# Patient Record
Sex: Female | Born: 1965
Health system: Southern US, Community
[De-identification: ages and names within clinical notes are randomized; demographics above are authoritative.]

## PROBLEM LIST (undated history)

## (undated) DIAGNOSIS — F32A Depression, unspecified: Secondary | ICD-10-CM

## (undated) DIAGNOSIS — F419 Anxiety disorder, unspecified: Secondary | ICD-10-CM

## (undated) DIAGNOSIS — I10 Essential (primary) hypertension: Secondary | ICD-10-CM

## (undated) DIAGNOSIS — D369 Benign neoplasm, unspecified site: Secondary | ICD-10-CM

## (undated) HISTORY — DX: Essential (primary) hypertension: I10

## (undated) HISTORY — PX: POLYPECTOMY: SHX149

## (undated) HISTORY — DX: Depression, unspecified: F32.A

## (undated) HISTORY — DX: Benign neoplasm, unspecified site: D36.9

## (undated) HISTORY — DX: Anxiety disorder, unspecified: F41.9

## (undated) HISTORY — PX: COLONOSCOPY: SHX174

## (undated) HISTORY — PX: HERNIA REPAIR: SHX51

## (undated) HISTORY — PX: TUBAL LIGATION: SHX77

---

## 2000-06-21 ENCOUNTER — Other Ambulatory Visit: Admission: RE | Admit: 2000-06-21 | Discharge: 2000-06-21 | Payer: Self-pay | Admitting: Obstetrics & Gynecology

## 2000-07-06 ENCOUNTER — Encounter: Admission: RE | Admit: 2000-07-06 | Discharge: 2000-10-04 | Payer: Self-pay | Admitting: Obstetrics & Gynecology

## 2000-12-27 ENCOUNTER — Inpatient Hospital Stay (HOSPITAL_COMMUNITY): Admission: AD | Admit: 2000-12-27 | Discharge: 2000-12-30 | Payer: Self-pay | Admitting: Obstetrics & Gynecology

## 2000-12-27 ENCOUNTER — Encounter (INDEPENDENT_AMBULATORY_CARE_PROVIDER_SITE_OTHER): Payer: Self-pay

## 2000-12-31 ENCOUNTER — Encounter: Admission: RE | Admit: 2000-12-31 | Discharge: 2001-01-30 | Payer: Self-pay | Admitting: Obstetrics & Gynecology

## 2001-01-28 ENCOUNTER — Other Ambulatory Visit: Admission: RE | Admit: 2001-01-28 | Discharge: 2001-01-28 | Payer: Self-pay | Admitting: Obstetrics & Gynecology

## 2002-04-01 ENCOUNTER — Other Ambulatory Visit: Admission: RE | Admit: 2002-04-01 | Discharge: 2002-04-01 | Payer: Self-pay | Admitting: Obstetrics & Gynecology

## 2004-01-06 ENCOUNTER — Other Ambulatory Visit: Admission: RE | Admit: 2004-01-06 | Discharge: 2004-01-06 | Payer: Self-pay | Admitting: Obstetrics & Gynecology

## 2005-06-06 ENCOUNTER — Other Ambulatory Visit: Admission: RE | Admit: 2005-06-06 | Discharge: 2005-06-06 | Payer: Self-pay | Admitting: Obstetrics and Gynecology

## 2006-10-26 ENCOUNTER — Inpatient Hospital Stay (HOSPITAL_COMMUNITY): Admission: RE | Admit: 2006-10-26 | Discharge: 2006-10-29 | Payer: Self-pay | Admitting: Obstetrics & Gynecology

## 2007-05-25 ENCOUNTER — Inpatient Hospital Stay (HOSPITAL_COMMUNITY): Admission: RE | Admit: 2007-05-25 | Discharge: 2007-05-26 | Payer: Self-pay | Admitting: Surgery

## 2007-05-31 ENCOUNTER — Ambulatory Visit (HOSPITAL_COMMUNITY): Admission: RE | Admit: 2007-05-31 | Discharge: 2007-05-31 | Payer: Self-pay | Admitting: Surgery

## 2008-07-10 ENCOUNTER — Ambulatory Visit (HOSPITAL_COMMUNITY): Admission: RE | Admit: 2008-07-10 | Discharge: 2008-07-10 | Payer: Self-pay | Admitting: Surgery

## 2008-07-10 ENCOUNTER — Encounter (INDEPENDENT_AMBULATORY_CARE_PROVIDER_SITE_OTHER): Payer: Self-pay | Admitting: Surgery

## 2011-01-31 NOTE — Op Note (Signed)
NAME:  NAUTIA, LEM NO.:  [id number]   MEDICAL RECORD NO.:  [id number]          PATIENT TYPE:  AMB   LOCATION:  DAY                          FACILITY:  [facility]   PHYSICIAN:  Ardeth Sportsman, MD     DATE OF BIRTH:  06/01/1966   DATE OF PROCEDURE:  05/24/2007  DATE OF DISCHARGE:                               OPERATIVE REPORT   PRIMARY CARE PHYSICIAN:  Gabriel Earing, MD   OB/GYN:  Freddy Finner, MD   SURGEON:  Ardeth Sportsman, MD   ASSISTANT:  Wilmon Arms. Tsuei, MD   PREOPERATIVE DIAGNOSES:  1. Suprapubic ventral wall incisional hernia, status post cesarean      section.  2. Periumbilical ventral wall hernia.   POSTOPERATIVE DIAGNOSES:  1. Suprapubic ventral wall incisional hernia, incarcerated, status      post cesarean section.  2. Periumbilical ventral wall hernia.   PROCEDURES PERFORMED:  1. Laparoscopic lysis of adhesions x30 minutes.  2. Laparoscopic suprapubic ventral wall hernia repair with 20 x 30 cm      mesh, (low-density polypropylene/cellulose anti-coated dual-sided      mesh).  3. Laparoscopic ventral hernia repair with periumbilical ventral wall      hernia with 15 x 20 cm mesh, (low-density polypropylene/cellulose      anti-coated dual-sided mesh).  4. Repair of bladder injury in two-layer closure.  5. Bladder tacking to pubis.   ANESTHESIA:  1. General anesthesia.  2. Local anesthetic in a field block around all port and incisions      sites.   SPECIMENS:  None.   DRAINS:  None.   ESTIMATED BLOOD LOSS:  10 mL.   COMPLICATIONS:  Small bladder injury on the anterior dome of bladder  wall, repaired in two layers.   INDICATIONS:  Ms. Sonntag is a pleasant 45 year old severely obese female  who had to have a C-section for delivery of her child back in May  earlier this year.  She developed a postoperative hematoma and,  unfortunately, developed an incisional hernia.  She was initially sent  to my partner, Dr. Earlene Plater, but she was  sent to me for consideration of  laparoscopic repair.  She also had a periumbilical hernia as well.   The anatomy and physiology of abdominal formation was explained.  Pathophysiology of herniation with its risks of incarceration,  strangulation, debilitating pain and its natural history discussed.  Options were discussed and a recommendation was made for laparoscopic  lysis of adhesions and repair of suprapubic and periumbilical hernias.  Risks such as stroke, heart attack, deep venous thrombosis, pulmonary  embolism and death were discussed.  Risks such as bleeding, need for  transfusion, wound infection, abscess, injury the other organs,  prolonged pain, recurrent hernia, another reoperation, conversion to  open, abd other risks were discussed.  Questions answered and she agreed  to proceed.   OPERATIVE FINDINGS:  1. She had an 8 x 6 cm suprapubic midline ventral wall hernia defect.  2. She had a 4x4 cm periumbilical hernia defect.  3. She had some thick adhesions between her pubis and her bladder  with      a resulting of bladder injury, but it was very small.  It was a      millimeter mucosal injury that was easily closed in two layers.  4. Her hernia was incarcerated with omentum.  5. Her periumbilical ventral hernia was not incarcerated.   DESCRIPTION OF PROCEDURE:  Informed consent was confirmed.  The patient  received IV cefazolin just prior to surgery.  She had sequential  compression devices active during the entire case.  Informed consent was  confirmed.  She underwent general anesthesia without any difficulty.  She was positioned supine with both arms tucked.  Her abdomen and pubis  were clipped, prepped and draped in a sterile fashion.  Entry was gained  in the abdomen with the patient in steep reverse Trendelenburg and left  side up, and placement of a 5 mm port was done using optical entry with  the 5 mm/30-degree scope.  Camera inspection revealed no intra-abdominal   injury.  Under direct visualization the 5-mm ports were placed in the  left flank, left lower quadrant, right upper quadrant and right flank.  A 10-mm port was placed infraumbilically through the periumbilical  hernia.   Diagnostic laparoscopy revealed some dense omental adhesions  incarcerated within her suprapubic hernia.  These were freed off using a  careful harmonic dissection.  The patient was placed in the  Trendelenburg position to allow the abdominal contents to free off of  the pelvis.  A nick was made in the peritoneal covering over the pubic  bone and careful dissection was done to help free the bladder off the  anterior pelvis.  Unfortunately, during dissection a nick was made in  the bladder.  It was about a 1-mm hole.  I closed the stitch  laparoscopically in two layers using 3-0 PDS stitch including a mucosal  stitch to good result.  Further dissection was done to help free the  dome of the bladder off the anterior pelvic wall.  Careful dissection  was done laterally.  The peritoneum was taken down in sort of a TAPP  fashion over the anterior pelvic rim and over laterally in bilateral  lower quadrants.  The round ligaments were identified, skeletonized and  ligated and transected using ultrasonic dissection to allow the pelvic  contents to fall away.   Defect was measured and a 20 x 30-cm mesh was chosen.  It had #1 PDS and  Ethibond alternating stitches x6 done on the superior half of the rim.  The mesh was rolled up, placed in the abdomen and unrolled.  The mesh  was tucked down way down into the anterior wall pelvis and allowed to  lay over the lateral pelvic sidewalls and up over the pelvic brim onto  the anterior abdominal walls bilaterally.  This inferior half of the  mesh had no fascial stitches placed on it yet.  The superior rim was  tacked to the anterior abdominal wall using a laparoscopic suture passer  under direct visualization such that there were six good  transfascial  bites to help tack the mesh to the anterior abdominal wall.  Tisseel was  placed on the rough side of the inferior half of the mesh and the mesh  was pushed up onto the anterior pelvic wall to help encourage the mesh  to adhere to the pelvic rim and the anterior pelvic wall contents.  Under direct visualization #1 Prolene stitches x2 were brought over the  pubic bone, through the  mesh and then grabbed and placed back in for two  further transfascial stitches to help hold the middle part of the mesh  to the anterior abdominal wall at the level of the pubic bone since I  could not go inferior to that.  Tacks were placed circumferentially  around the edges of the anterior abdominal wall.  I did not use any  tacks down over the pelvic brim or down into the pelvis.  The mesh was  seen to lay well with the help of the Tisseel to the intra-abdominal  wall, and there was no exposed mesh.   Inspection was done and the mesh, unfortunately, could not reach up and  cover periumbilical ventral hernia defect either.  That was measured to  the appropriate effect and a 15 x 20-cm dual-sided Proceed mesh was  placed in a horizontal fashion and tacked to the anterior abdominal wall  using six #1 alternating Ethibond and Prolene stitches to the anterior  abdominal wall to good effect.  A tacker was used to help secure the  rims of the mesh to the anterior wall.  There was overlap of both meshes  and the bottom two fascial stitches of the superior mesh went through  the larger mesh inferiorly to help encourage the meshes to adhere  together.  Tacks were used to also help encourage this as well.  The  mesh covered virtually all the anterior abdominal wall well and there  was overlap of several inches circumferentially around both hernia  defects.   Inspection was made of the bladder and again there was good closure.  The bladder was tacked back to the anterior pelvic wall at the level of  the  pubis bone using a 2-0 PDS to help tack the anterior dome of the  bladder up through the mesh and get some periosteum of the pelvic bone  and back out.  It was secured at both corners and with a running stitch  as well in between to good result.  There was no evidence of any uterine  prolapse or other abnormalities.  Camera inspection revealed no intra-  abdominal injury or bleeding.  Mild irrigation was done with clear  return.  The umbilical port, 10-mm port, was already covered by mesh.  The 5-mm were small defects that did not require any more aggressive  closure.  Under direct visualization all but one port was removed with  no evidence of any bleeding on the peritoneum or skin.  Capnoperitoneum  was completely evacuated from the remaining coordinate.  Ports were  removed.  Port site skin was closed using a 4-0 Monocryl stitch.  Sterile dressings were applied.  Small punctate fascial stitch holes  were closed using a Dermabond stitch.  A gentle binder was placed.  The  patient was extubated and sent to the recovery room and under stable  condition.   I explained the operative findings including the bladder injury and  repair to the patient's husband.  I called Dr. Annabell Howells with [facility] and he agreed with the plan to leave the Foley in for 7 days and  do follow-up  cystogram to ensure no evidence of continued bladder injury and remove  the in a week.  Because she has mesh in there, I will place her on oral  ciprofloxacin for 7 days to help decrease risk of bladder infection.  Questions were answered and the husband been expressed understanding and  appreciation for our efforts.  Ardeth Sportsman, MD  Electronically Signed     SCG/MEDQ  D:  05/24/2007  T:  05/24/2007  Job:  [id number]   cc:   Gabriel Earing, M.D.  Fax: [phone]   Freddy Finner, M.D.  Fax: [phone]   Wilmon Arms. Corliss Skains, M.D.  [address] [place] [address] [place] [address]  [place] J. Annabell Howells, M.D.  Fax:  [phone]

## 2011-01-31 NOTE — Op Note (Signed)
NAME:  Tina Scott, GUEST NO.:  [id number]   MEDICAL RECORD NO.:  [id number]          PATIENT TYPE:  AMB   LOCATION:  DAY                          FACILITY:  [facility]   PHYSICIAN:  Ardeth Sportsman, MD     DATE OF BIRTH:  07-24-1966   DATE OF PROCEDURE:  DATE OF DISCHARGE:                               OPERATIVE REPORT   PRIMARY CARE PHYSICIAN:  Gabriel Earing, M.D.   Also followed by Dr. Jennette Kettle of OB/GYN.   SURGEON:  Ardeth Sportsman, M.D.   ASSISTANT:  Leonie Man, M.D.   PREOPERATIVE DIAGNOSIS:  Suprapubic recurrence of ventral wall hernia.   POSTOPERATIVE DIAGNOSIS:  Suprapubic recurrence of ventral wall hernia.   PROCEDURE PERFORMED:  1. Diagnostic laparoscopy.  2. Open ventral hernia repair with underlay mesh.   ANESTHESIA:  1. General.  2. Local and second field block.   SPECIMENS:  Hernia sac.   DRAINS:  None.   ESTIMATED BLOOD LOSS:  Less than 20 ml.   COMPLICATIONS:  None apparent.   INDICATIONS:  [name] is a 45 year old morbidly obese female who had  periumbilical and suprapubic ventral hernias who underwent repairs,  including bladder take-down and repair in September, 2008.  She came  back to me in late August with evidence of recurrence.   The anatomy and physiology of abdominal formation was discussed, the  pathophysiology of herniation with its risks of incarceration,  strangulation, debilitating pain was discussed.  Options were discussed.  After discussing with numerous partners, the recommendation was made for  diagnostic laparoscopy to make sure there was no evidence of any  incarceration or strangulation.  The probability of laparoscopic versus  open repair was discussed.  The risks, benefits and alternatives,  including hernia recurrence was discussed.  Questions were answered.  She agreed to proceed.   OPERATIVE FINDINGS:  She had a hernia sac with some omentum incarcerated  within it.  There was no evidence of any bowel or  any other  abnormalities in the area.  She had a few adhesions around her  periumbilical ventral hernia and suprapubic ventral hernia repair.  The  mesh seemed to have pulled up in that corner, exposing 2x3cm of hernia  defect.  The bladder was more inferior and caudal to the area of  concern.   DESCRIPTION OF PROCEDURE:  Informed consent was confirmed.  The patient  received IV cefazolin prior to surgery.  She had sequential compression  devices active the entire case.  She underwent transfusion without any  difficulty.  She was positioned supine with arms tucked.  Her abdomen  and perineum were clipped, prepped and draped in a sterile fashion.   A 5-0 port was placed using optical entry technique in the left upper  quadrant and the patient in steep Trendelenburg and left-side up after  an orogastric tube had been used to decompress the stomach.  Camera  inspection revealed no internal abdominal injury.  Under direct  visualization, a 5.0 port was placed in the left mid flank.   Camera inspection revealed moderate omental adhesions to the rim of the  periumbilical ventral hernia mesh.  There were less adhesions down in  the pelvis.  I could see some omentum going up into a hernia defect that  I could push back in and confirm.  There is no bowel or any other vicera  in there.  It was hard to tell exactly where the bladder was for  certain.  We did have a three-way balloon, a three-way Foley, placed.  We filled up the three-way Foley with some diluted methylene blue into  the bladder.  We could see that the dome of the bladder was several  inches inferior to the hernia ridge.   At this point, it was decided to convert to an open underlay repair.  An  8-cm incision was made over the prior Pfannenstiel incision and had  gotten through subcutaneous tissues.  We immediately encountered a  moderate-sized hernia sac.  We were able to skeletonize the hernia sac  all the way down to the  stalk.  We were able to gradually free off the  hernia sac circumferentially until we got to a healthy fascial ridge.  The fascial defect was about 2 x 3 cm in size.  Doing that, we did get  into the peritoneal cavity.  The bladder was reinflated, and again, the  bladder dome was about 5 cm inferior towards the ridge of the hernia.  After discussion with Dr. Lurene Shadow, it was decided to do an underlay  repair using a Proceed umbilical hernia patch type of mesh.  I used #1  Novofil and took some good horizontal mattress bites, taking a bite  through the fascia, then in and out of the mesh, then back out the  fascia in the four corners to help tuck the Proceed hernia patch in.  There was about a 4.5 cm diameter patch.  The stitches were tied down.  The tails were trimmed down and tucked back in.  The hernia defect was  primarily closed using #1 PDS figure-of-eight buried stitches to good  approximation.  Hemostasis was good.  The deep subcutaneous tissues were  reapproximated using interrupted figure-of-eight 4-0 Monocryl stitches,  and the skin was closed using a running 4-0 Monocryl stitch through a  large incision.   Diagnostic laparoscopy was reperformed.  The mesh seemed to patch the  area well, and there was no evidence of any involvement in the other  issues.  The bladder was reinflated, and again, it was below where the  mesh was and not injured.  There was no leak of any methylene blue or  any evidence of bladder injury.  Bowel looked otherwise normal.  I  elected to leave the omental adhesions to the mesh alone, since there  was just omentum, and there was no  evidence of any bowel obstruction or other abnormalities.  The patient  was extubated and sent to the recovery room in stable condition.   I am about to explained operative findings to the patient's family.      Ardeth Sportsman, MD  Electronically Signed     SCG/MEDQ  D:  07/10/2008  T:  07/10/2008  Job:  [id number]    cc:   Gabriel Earing, M.D.  Fax: [phone]   Freddy Finner, M.D.  Fax: [phone]   Dr. Azucena Kuba

## 2011-02-03 NOTE — Discharge Summary (Signed)
[facility]  Patient:    Tina Scott, Tina Scott                     MRN: [id number] Adm. Date:  [id number] Disc. Date: [id number] Attending:  Rhina Brackett Dictator:   Danie Chandler, R.N.                           Discharge Summary  ADMITTING DIAGNOSES:          1. A 36 6/7 week intrauterine pregnancy.                               2. Preterm premature rupture of membranes.                               3. Breech presentation.  DISCHARGE DIAGNOSES:          1. A 36 6/7 week intrauterine pregnancy.                               2. Preterm premature rupture of membranes.                               3. Breech presentation.  PROCEDURE:                    On December 27, 2000 primary low transverse cesarean section.  REASON FOR ADMISSION:         The patient is a 45 year old gravida 1, para 0 with an estimated date of delivery of Jan 17, 2001.  The patient has a known breech presentation and was scheduled for cesarean section later in the month.  The patient presented with rupture of membranes, thin meconium.  Ultrasound verified breech presentation.  The plan was to proceed with a primary low transverse cesarean section.  HOSPITAL COURSE:              The patient was taken to the operating room and underwent the above named procedure without complication.  This was productive of a viable female infant with Apgars of 8 at one minute and 9 at five minutes.  Postoperatively on day #1 the patient was without complaint.  Her hemoglobin on this day was 10.3, hematocrit 29.8, and white blood cell count 14.5.  On postoperative day #2 the patient had good control of pain, was ambulating well without difficulty.  She also had a return of bowel function and was tolerating a regular diet.  She was discharged home on postoperative day #3.  CONDITION ON DISCHARGE:       Good.  DIET:                         Regular, as tolerated.  ACTIVITY:                      No heavy lifting, no driving, no vaginal entry.  FOLLOW-UP:                    She is to follow-up in the office in one to two weeks for incision check.  She is to call for temperature greater than  100 degrees, persistent nausea or vomiting, heavy vaginal bleeding, and/or redness or drainage from the incision site.  DISCHARGE MEDICATIONS:        1. Prenatal vitamins one p.o. q.d.                               2. Percocet #40 as directed by M.D.                               3. Motrin #30 as directed by M.D.DD:  01/14/01 TD:  01/14/01 Job: [id number]

## 2011-02-03 NOTE — Discharge Summary (Signed)
NAMEOVAL, MORALEZ NO.:  [id number]   MEDICAL RECORD NO.:  [id number]          PATIENT TYPE:  INP   LOCATION:  9129                          FACILITY:  [facility]   PHYSICIAN:  Juluis Mire, M.D.   DATE OF BIRTH:  Oct 24, 1965   DATE OF ADMISSION:  10/26/2006  DATE OF DISCHARGE:  10/29/2006                               DISCHARGE SUMMARY   ADMITTING DIAGNOSES:  1. Intrauterine pregnancy at term.  2. Previous cesarean section requests repeat cesarean delivery.  3. Multiparity desires permanent sterilization.   DISCHARGE DIAGNOSES:  1. Status post low transverse cesarean section.  2. Viable female infant.  3. Permanent sterilization.   PROCEDURES:  1. Repeat low transverse cesarean section.  2. Bilateral tubal ligation.   HOSPITAL COURSE:  The patient was a 45 year old white married female  gravida 2, para 1 that presented to [facility] for  scheduled cesarean section.  The patient had a previous cesarean section  and desired repeat.  Due to multiparity, the patient also had requested  permanent sterilization.  On the morning of admission, the patient was  taken to the operating room where spinal anesthesia was administered  without difficulty.  Low transverse incision was made with the delivery  of a viable female infant weighing 8 pounds 11 ounces with Apgars of 9 at  one minute and 9 at five minutes.  Arterial cord pH was 7.32.  Bilateral  tubal ligation was performed using Filshie clips.  The patient tolerated  procedure well and was taken to the recovery room in stable condition.  On postoperative day #1, the patient was without complaint; vital signs  were stable; she was afebrile; abdomen soft; fundus firm and nontender;  abdominal dressing is noted to be clean, dry and intact; urine output  was noted to be within normal limits.  On postoperative day #2, the  patient was without complaint; vital signs remained stable; she was  afebrile;  fundus firm and nontender; abdominal dressing had been removed  revealing an incision that was clean, dry and intact.  Laboratory  findings revealed hemoglobin of 9.6.  On postoperative day #3, the  patient complained of some pain in the right hip without paresthesia;  pain was only exhibited with the patient standing; she was not having  any difficulty with ambulation; vital signs were stable; she was  afebrile; abdomen soft with good return of bowel function; fundus was  firm and nontender; incision was noted to have a slight amount of  drainage near the right margin of the incision; staples were left in  place.  Discharge instructions reviewed and the patient was later  discharged home.   CONDITION ON DISCHARGE:  Good.   DIET:  Regular as tolerated.   ACTIVITY:  No heavy lifting, no driving x2 weeks, no vaginal entry.   FOLLOW UP:  Patient to follow up in the office in 2-3 days for staple  removal.  She is to call for temperature greater than 100 degrees,  persistent nausea, vomiting, heavy vaginal bleeding and/or redness or  drainage from the incisional site.   DISCHARGE  MEDICATIONS:  1. Percocet 5/325 #30 one p.o. every 4-6 hours p.r.n.  2. Motrin 600 mg every 6 hours.  3. Prenatal vitamins one p.o. daily.  4. Colace one p.o. daily p.r.n.      Julio Sicks, N.P.      Juluis Mire, M.D.  Electronically Signed    CC/MEDQ  D:  11/18/2006  T:  11/18/2006  Job:  [id number]

## 2011-02-03 NOTE — H&P (Signed)
[facility]  Patient:    Tina Scott, Tina Scott                     MRN: [id number] Adm. Date:  [id number] Attending:  Rhina Brackett                         History and Physical  CHIEF COMPLAINT:              Ruptured membranes at 36 weeks, history of breech presentation.  HISTORY OF PRESENT ILLNESS:   A 45 year old G1 P0, EDD May 2.  This patient is a known breech presentation, was scheduled for cesarean later this month, presents with rupture of membranes, thin meconium.  Ultrasound verified breech presentation.  The plan is to proceed with primary low transverse cesarean section.  PAST MEDICAL HISTORY:  ALLERGIES:                    None.  OPERATIONS:                   Oral surgery at age 16, otherwise unremarkable.  LABORATORY DATA:              Blood type B positive.  Rubella titer is positive.  One-hour GCT was 127.  PHYSICAL EXAMINATION:  VITAL SIGNS:                  Temperature 98.6, blood pressure 133/72.  HEENT:                        Unremarkable.  NECK:                         Supple without masses.  LUNGS:                        Clear.  CARDIOVASCULAR:               Regular rate and rhythm without murmurs, rubs, or gallops noted.  BREASTS:                      Not examined.  ABDOMEN:                      Term fundal height, fetal heart rate 140. Breech by ultrasound.  PELVIC:                       Cervix was 1 cm, long.  Thin meconium fluid was noted.  EXTREMITIES AND NEUROLOGIC:   Unremarkable.  IMPRESSION:                   1. Intrauterine pregnancy at 36 and six-sevenths                                  weeks.                               2. Preterm premature rupture of membranes.                               3. Breech presentation.  PLAN:  Primary cesarean section.  Procedure and risk discussed including risk of bleeding, infection, adjacent organ injury, possible need for a  transfusion. DD:  12/27/00 TD:  12/27/00 Job: [id number]

## 2011-02-03 NOTE — Op Note (Signed)
NAMEMarland Kitchen  Tina Scott, Tina Scott NO.:  [id number]   MEDICAL RECORD NO.:  [id number]          PATIENT TYPE:  INP   LOCATION:  9129                          FACILITY:  [facility]   PHYSICIAN:  Freddy Finner, M.D.   DATE OF BIRTH:  Oct 16, 1965   DATE OF PROCEDURE:  10/26/2006  DATE OF DISCHARGE:  10/29/2006                               OPERATIVE REPORT   OPERATIVE PROCEDURE:  1. Repeat low transverse cervical cesarean section.  2. Bilateral tubal ligation using Filshie clips.   SURGEON:  Freddy Finner, M.D.   ANESTHESIA:  Spinal.   ESTIMATED INTRAOPERATIVE BLOOD LOSS:  Less than or equal to 800 mL.   INTRAOPERATIVE COMPLICATIONS:  None.   INDICATION:  The patient is a 45 year old multipara who is admitted now  for repeat cesarean delivery.  She is admitted at term.   DESCRIPTION OF PROCEDURE:  She was brought to the operating room and  there placed under adequate spinal anesthesia and placed in the dorsal  recumbent position with elevation of the right hip.  Because of massive  obesity, the abdomen was tight and retracted by the ether screen.  The  abdomen was then prepped in the usual fashion.  Foley catheter was  placed using sterile technique.  Sterile drapes were applied.  A  transverse lower abdominal incision was made through an old scar and  carried sharply down to fascia, which was entered sharply and extended  to the extent of the skin incision.  Rectus sheath was developed  superiorly and inferiorly with blunt and sharp dissection.  Rectus  muscles were divided in the midline.  Peritoneum was entered sharply and  extended sharply and bluntly to the extent of the skin incision.  Bladder blade was placed.  A transverse incision was made in the  visceroperitoneum overlying the lower segment and bladder bluntly  distended off the left uterine segment.  A transverse incision was made  in the lower uterine segment.  Amnion was entered; fluid was clear.  The  incision  was extended bluntly in a transverse direction.  A Kiwi was  then used to deliver a viable female infant.  Cord blood was obtained for  arterial gases and for routine venous sampling.  Placenta and other  parts of conception were removed manually and this was confirmed by  manual exploration of the uterus.  Uterus was then delivered onto the  anterior abdominal wall.  Lower uterine incision was then closed in a  double layer; running locking 0 Monocryl was used for the first layer  and an imbricating suture of 0 Monocryl for the second.  Hemostasis was  adequate.  Bladder flap was reapproximated with an interrupted figure-of-  eight of 0 Monocryl.  Uterus, tubes and ovaries were normal.  The  Filshie clip device was then used to apply a clip to the isthmic portion  of each fallopian tube.  The uterus was then delivered back into the  abdominal cavity.  Irrigation was carried out.  Hemostasis was adequate.  All pack, needle and instruments were correct.  Abdominal incision was  closed in  layers; running 0 Monocryl was used to close the peritoneum,  fascia was  closed with running 0 PDS, subcutaneous tissue was approximated with a  running 2-0 plain and skin was closed with wide skin staples.  The  patient tolerated the operative procedure well.  She was taken to  Recovery in good condition.      Freddy Finner, M.D.  Electronically Signed     WRN/MEDQ  D:  11/30/2006  T:  11/30/2006  Job:  [id number]

## 2011-02-03 NOTE — H&P (Signed)
NAME:  Tina Scott, Tina Scott NO.:  [id number]   MEDICAL RECORD NO.:  [id number]          PATIENT TYPE:  INP   LOCATION:  NA                            [facility]ACILITY:  [facility]   PHYSICIAN:  Freddy Finner, M.D.   DATE OF BIRTH:  [birth year]   DATE OF ADMISSION:  10/26/2006  DATE OF DISCHARGE:                              HISTORY & PHYSICAL   ADMISSION DIAGNOSES:  1. Intrauterine pregnancy at term.  2. Surgically scarred uterus by previous cesarean delivery.   HISTORY OF PRESENT ILLNESS:  The patient is a 45 year old white married  female, gravida 2, para 1 with her first infant being delivered by  cesarean for breech.  She has requested surgical delivery for this  pregnancy as well as tubal sterilization.  She is admitted at this time  for that purpose.  Her estimated gestational age will be 38-5/7 on the  day of her admission.  Her prenatal course has been essentially  uncomplicated.   CURRENT REVIEW OF SYSTEMS:  Negative.  No cardiopulmonary, GI, or GU  complaints.   PAST MEDICAL HISTORY/FAMILY HISTORY:  According to the prenatal summary  and will not be repeated.   PHYSICAL EXAMINATION:  HEENT:  Grossly within normal limits.  NECK:  Thyroid gland is not palpably enlarged.  VITAL SIGNS:  Blood pressure in the office 126/80.  CHEST:  Clear to auscultation.  HEART:  Normal sinus rhythm without murmurs, rubs, or gallops.  ABDOMEN:  Gravid.  Estimated fetal size of 8 - 8.5 pounds.  EXTREMITIES:  Without clubbing, cyanosis, or edema.   ASSESSMENT:  1. Intrauterine pregnancy at term.  2. Surgically scarred uterus.  3. Multiparity with this delivery and request of sterilization.   PLAN:  Repeat cesarean delivery and tubal sterilization.      Freddy Finner, M.D.  Electronically Signed     WRN/MEDQ  D:  10/25/2006  T:  10/25/2006  Job:  [id number]

## 2011-02-03 NOTE — Op Note (Signed)
NAMEMarland Kitchen  Tina Scott, Tina Scott NO.:  [id number]   MEDICAL RECORD NO.:  [id number]          PATIENT TYPE:  INP   LOCATION:  9199                          [facility]ACILITY:  [facility]   PHYSICIAN:  Freddy Finner, M.D.   DATE OF BIRTH:  04-04-1966   DATE OF PROCEDURE:  10/26/2006  DATE OF DISCHARGE:                    STAT - MUST CHANGE TO CORRECT WORK TYPE   PREOPERATIVE DIAGNOSES:  1. Intrauterine pregnancy at term.  2. Surgically scarred uterus.  3. Request for permanent sterilization.   POSTOPERATIVE DIAGNOSES:  1. Intrauterine pregnancy at term.  2. Surgically scarred uterus.  3. Request for permanent sterilization.   OPERATION/PROCEDURE:  1. Repeat low transverse cervical cesarean section with delivery of      viable female infant. Apgar's of 9 and 9, arterial cord pH is 7.32,      birth weight 8 pounds 11 ounces.  2. Tubal sterilization using Filshie clips.   SURGEON:  Freddy Finner, M.D.   ANESTHESIA:  Spinal.   ESTIMATED BLOOD LOSS:  Less than or equal to 800 mL.   COMPLICATIONS:  None.   The patient was admitted on the morning of surgery. She was given an IV  bolus of Ancef with clamping of the cord. She was brought to the  operating room, she was placed under adequate spinal anesthesia, placed  in the dorsal recumbent position with elevation of the right hip. The  abdomen was prepped and draped in the usual fashion and Foley catheter  placed using sterile technique. A lower abdominal transverse incision  was made through an old scar and carried sharply down to fascia. The  fascia was entered sharply and extended to the extent of the skin  incision. Subcutaneous bleeding vessels were controlled with Bovie. A  rectus sheath was developed superiorly and inferiorly with blunt and  sharp dissection. The rectus muscles were divided in the midline. The  peritoneum was entered sharply and extended bluntly and sharply to the  extent of the skin incision. A bladder blade  was placed. A moist pack  was used to pack the intestinal contents out of the pelvis. A transverse  incision was made in the vesicoperitoneum overlying the lower uterine  segment. Bladder dissected off of the lower segment. A transverse  incision was made in the lower segment which was very thin. Amniotic  fluid was clear. The incision was extended bluntly in a transverse  direction. The kiwi was applied, easy delivery of a viable female infant  with statistics as noted above was then accomplished. The placenta and  other parts of conception were removed from the uterus. The uterus was  delivered onto the anterior abdominal wall. The uterus itself as well as  the fallopian tubes and ovaries were all normal. The uterine incision  was then closed in a double layer with running locking #0 Monocryl for  the first layer and an imbricating suture of #0 Monocryl for the second  layer. A figure-of-eight was required to the right of midline for  complete hemostasis. Small bleeding sources along the bladder flap were  controlled with the Bovie. A Filshie clip was applied to  the isthmic  portion of each fallopian tube. The uterus was then delivered back into  the abdominal cavity. Irrigation was carried out, hemostasis was  complete. Pack, needle and instrument counts were correct. The abdominal  incision was closed in layers. Running #0 Monocryl was used to close the  peritoneum and reapproximate the rectus muscles. The fascia was closed  with double loop #0 PDS running from angle to angle on either side. The  subcu was approximated with a running 2-0 plain. The skin was closed  with wide skin staples. The patient was taken to the recovery room in  good condition.      Freddy Finner, M.D.  Electronically Signed     WRN/MEDQ  D:  10/26/2006  T:  10/26/2006  Job:  [id number]

## 2011-02-03 NOTE — Op Note (Signed)
[facility]  Patient:    Tina Scott, Tina Scott                     MRN: [id number] Proc. Date: 12/27/00 Adm. Date:  [id number] Attending:  Rhina Brackett                           Operative Report  PREOPERATIVE DIAGNOSES:       1. A 36-6/7 week intrauterine pregnancy.                               2. Preterm premature rupture of membranes.                               3. Breech presentation.  POSTOPERATIVE DIAGNOSES:      1. A 36-6/7 week intrauterine pregnancy.                               2. Preterm premature rupture of membranes.                               3. Breech presentation.  PROCEDURE:                    Primary low transverse cesarean section.  SURGEON:                      Duke Salvia. Marcelle Overlie, M.D.  ANESTHESIA:                   Spinal.  COMPLICATIONS:                None.  DRAINS:                       Foley catheter.  ESTIMATED BLOOD LOSS:         800 cc.  PROCEDURE AND FINDINGS:       The patient was taken to the operating room. After an adequate level of spinal anesthetic was obtained with the patient in the left tilt position, the abdomen was prepped and draped in the usual manner for a sterile abdominal procedure.  A Foley catheter was positioned draining clear urine.  A Pfannenstiel incision was made two fingerbreadths above the symphysis and carried down to the fascia, which was incised and extended transversely.  The rectus muscle was divided in the midline.  The peritoneum was entered superiorly without incident and extended in a vertical manner. The vesicouterine cervix was then incised and the bladder was bluntly and sharply dissected off of the lower uterine segment.  A bladder blade was positioned.  A transverse incision was made in the lower segment and extended with bandage scissors.  The patient was delivered of a female, 6 lb 14 oz, with Apgars of 8 and 9 from the frank breech presentation.  Cord pH was  sent. The infant was suctioned.  The cord was clamped and the infant was passed to the pediatric team for further care.  Very minimal to absent meconium was noted by my exam.  After the placenta was removed, the uterus was exteriorized.  The cavity was wiped clean with a laparotomy pack.  Closure was obtained in the first layer with 0 chromic in a lock fashion, followed by an imbricating layer of 0 chromic.  This was hemostatic.  The tubes and ovaries were inspected and noted to be normal.  Prior to closures, sponge, needle and instrument counts were reported as correct x 2.  The rectus muscles were reapproximated in the midline with 2-0 Dexon interrupted sutures.  The fascia was closed from lateral to midline on either side with a 0 Dexon running suture.  The subcutaneous fat was hemostatic.  Clips and Steri-Strips were used on the skin.  She tolerated this well and went to the recovery room in good condition.  She did receive ampicillin IV preoperatively and Pitocin after the cord was clamped. DD:  12/27/00 TD:  12/27/00 Job: [id number]

## 2011-06-19 LAB — HEMOGLOBIN AND HEMATOCRIT, BLOOD
HCT: 36.6
Hemoglobin: 12.4

## 2011-06-30 LAB — HEMOGLOBIN AND HEMATOCRIT, BLOOD
HCT: 37.4
Hemoglobin: 12.9

## 2011-06-30 LAB — PREGNANCY, URINE: Preg Test, Ur: NEGATIVE

## 2014-09-07 ENCOUNTER — Other Ambulatory Visit: Payer: Self-pay | Admitting: Obstetrics and Gynecology

## 2014-09-08 LAB — CYTOLOGY - PAP

## 2016-08-18 DIAGNOSIS — I10 Essential (primary) hypertension: Secondary | ICD-10-CM | POA: Insufficient documentation

## 2017-01-02 DIAGNOSIS — G5601 Carpal tunnel syndrome, right upper limb: Secondary | ICD-10-CM | POA: Insufficient documentation

## 2020-05-10 ENCOUNTER — Telehealth: Payer: Self-pay | Admitting: General Practice

## 2020-05-10 NOTE — Telephone Encounter (Signed)
Patient sent e-mail requesting to establish care at our office. Left voicemail to give the office a call back to schedule.

## 2020-05-25 ENCOUNTER — Other Ambulatory Visit: Payer: Self-pay | Admitting: Nurse Practitioner

## 2020-05-25 ENCOUNTER — Ambulatory Visit: Payer: 59 | Admitting: Nurse Practitioner

## 2020-05-25 ENCOUNTER — Other Ambulatory Visit: Payer: Self-pay

## 2020-05-25 ENCOUNTER — Encounter: Payer: Self-pay | Admitting: Nurse Practitioner

## 2020-05-25 VITALS — BP 126/88 | HR 62 | Temp 97.1°F | Ht 64.0 in | Wt 220.8 lb

## 2020-05-25 DIAGNOSIS — Z860100 Personal history of colon polyps, unspecified: Secondary | ICD-10-CM | POA: Insufficient documentation

## 2020-05-25 DIAGNOSIS — I10 Essential (primary) hypertension: Secondary | ICD-10-CM | POA: Diagnosis not present

## 2020-05-25 DIAGNOSIS — E7800 Pure hypercholesterolemia, unspecified: Secondary | ICD-10-CM | POA: Diagnosis not present

## 2020-05-25 LAB — COMPREHENSIVE METABOLIC PANEL
ALT: 10 U/L (ref 0–35)
AST: 10 U/L (ref 0–37)
Albumin: 4.4 g/dL (ref 3.5–5.2)
Alkaline Phosphatase: 61 U/L (ref 39–117)
BUN: 10 mg/dL (ref 6–23)
CO2: 29 mEq/L (ref 19–32)
Calcium: 10.6 mg/dL — ABNORMAL HIGH (ref 8.4–10.5)
Chloride: 103 mEq/L (ref 96–112)
Creatinine, Ser: 0.65 mg/dL (ref 0.40–1.20)
GFR: 95.33 mL/min (ref >60.00)
Glucose, Bld: 76 mg/dL (ref 70–99)
Potassium: 4.1 mEq/L (ref 3.5–5.1)
Sodium: 139 mEq/L (ref 135–145)
Total Bilirubin: 0.6 mg/dL (ref 0.2–1.2)
Total Protein: 7.1 g/dL (ref 6.0–8.3)

## 2020-05-25 LAB — LIPID PANEL
Cholesterol: 197 mg/dL (ref 0–200)
HDL: 46.6 mg/dL (ref >39.00)
LDL Cholesterol: 128 mg/dL — ABNORMAL HIGH (ref 0–99)
NonHDL: 150.46
Total CHOL/HDL Ratio: 4
Triglycerides: 113 mg/dL (ref 0.0–149.0)
VLDL: 22.6 mg/dL (ref 0.0–40.0)

## 2020-05-25 MED ORDER — LISINOPRIL 20 MG PO TABS: 20.0000 mg | ORAL_TABLET | Freq: Every day | ORAL | 3 refills | Status: DC

## 2020-05-25 NOTE — Assessment & Plan Note (Signed)
>>  ASSESSMENT AND PLAN FOR HX OF COLONIC POLYP WRITTEN ON 05/25/2020 12:09 PM BY ,  LUM, NP  Last colonoscopy 01/2018: benign polyps, recommended repeat in 65yrs

## 2020-05-25 NOTE — Patient Instructions (Signed)
Go to lab for blood draw  Thank you for choosing [facility] for your healthy needs

## 2020-05-25 NOTE — Progress Notes (Signed)
Subjective:  Patient ID: Tina Scott, female    DOB: 23-Apr-1966  Age: 54 y.o. MRN: [id number]  CC: Establish Care (New patient/requesting labs since she takes lisinopril/ last labs completed in Nov. 2020)  HPI  Essential hypertension BP at goal with lisinopril No hx of CKD or hypertensive heart disease or OSA Exercise regimen: walking Diet: low sodium BP Readings from Last 3 Encounters:  05/25/20 126/88   Continue current medication Repeat CMP  Hx of colonic polyps Last colonoscopy 01/2018: benign polyps, recommended repeat in 9yrs  Reviewed past Medical, Social and Family history today.  Outpatient Medications Prior to Visit  Medication Sig Dispense Refill  . Ascorbic Acid (VITAMIN C PO) Take by mouth.    Marland Kitchen b complex vitamins tablet Take 1 tablet by mouth daily.    . Multiple Vitamin (MULTIVITAMIN ADULT PO) Take by mouth.    . Zinc Acetate, Oral, (ZINC ACETATE PO) Take by mouth.    Marland Kitchen lisinopril (ZESTRIL) 20 MG tablet Take 20 mg by mouth daily.     No facility-administered medications prior to visit.    ROS See HPI  Objective:  BP 126/88 (BP Location: Left Arm, Patient Position: Sitting, Cuff Size: Normal)   Pulse 62   Temp (!) 97.1 F (36.2 C) (Temporal)   Ht 5\' 4"  (1.626 m)   Wt 220 lb 12.8 oz (100.2 kg)   SpO2 98%   BMI 37.90 kg/m   Physical Exam Vitals reviewed.  Constitutional:      Appearance: She is obese.  Cardiovascular:     Rate and Rhythm: Normal rate and regular rhythm.     Pulses: Normal pulses.     Heart sounds: Normal heart sounds.  Pulmonary:     Effort: Pulmonary effort is normal.     Breath sounds: Normal breath sounds.  Musculoskeletal:     Right lower leg: No edema.     Left lower leg: No edema.  Neurological:     Mental Status: She is alert and oriented to person, place, and time.  Psychiatric:        Mood and Affect: Mood normal.        Behavior: Behavior normal.     Assessment & Plan:  This visit occurred during the  SARS-CoV-2 public health emergency.  Safety protocols were in place, including screening questions prior to the visit, additional usage of staff PPE, and extensive cleaning of exam room while observing appropriate contact time as indicated for disinfecting solutions.   Tina Scott was seen today for establish care.  Diagnoses and all orders for this visit:  Essential hypertension -     Comprehensive metabolic panel -     lisinopril (ZESTRIL) 20 MG tablet; Take 1 tablet (20 mg total) by mouth daily.  Pure hypercholesterolemia -     Lipid panel  Hx of colonic polyps    Problem List Items Addressed This Visit      Cardiovascular and Mediastinum   Essential hypertension - Primary    BP at goal with lisinopril No hx of CKD or hypertensive heart disease or OSA Exercise regimen: walking Diet: low sodium BP Readings from Last 3 Encounters:  05/25/20 126/88   Continue current medication Repeat CMP      Relevant Medications   lisinopril (ZESTRIL) 20 MG tablet   Other Relevant Orders   Comprehensive metabolic panel     Other   Hx of colonic polyps    Last colonoscopy 01/2018: benign polyps, recommended repeat in 66yrs  Pure hypercholesterolemia   Relevant Medications   lisinopril (ZESTRIL) 20 MG tablet   Other Relevant Orders   Lipid panel      Follow-up: Return in about 6 months (around 11/22/2020) for CPE (breast and pelvic exam, fasting).  Wilfred Lacy, NP

## 2020-05-25 NOTE — Assessment & Plan Note (Signed)
Last colonoscopy 01/2018: benign polyps, recommended repeat in 56yrs

## 2020-05-25 NOTE — Assessment & Plan Note (Signed)
BP at goal with lisinopril No hx of CKD or hypertensive heart disease or OSA Exercise regimen: walking Diet: low sodium BP Readings from Last 3 Encounters:  05/25/20 126/88   Continue current medication Repeat CMP

## 2020-07-05 MED FILL — LISINOPRIL 20 MG TABLET: 20 | 90 days supply | Qty: 90 | Fill #0

## 2020-09-16 MED FILL — LISINOPRIL 20 MG TABLET: 20 | 90 days supply | Qty: 90 | Fill #1

## 2020-11-24 ENCOUNTER — Encounter: Payer: Self-pay | Admitting: Nurse Practitioner

## 2020-11-24 ENCOUNTER — Other Ambulatory Visit: Payer: Self-pay

## 2020-11-24 ENCOUNTER — Other Ambulatory Visit (HOSPITAL_COMMUNITY)
Admission: RE | Admit: 2020-11-24 | Discharge: 2020-11-24 | Disposition: A | Discharge status: Home / Self Care | Payer: 59 | Source: Ambulatory Visit | Attending: Nurse Practitioner | Admitting: Nurse Practitioner

## 2020-11-24 ENCOUNTER — Ambulatory Visit (INDEPENDENT_AMBULATORY_CARE_PROVIDER_SITE_OTHER): Payer: 59 | Admitting: Nurse Practitioner

## 2020-11-24 ENCOUNTER — Other Ambulatory Visit: Payer: Self-pay | Admitting: Nurse Practitioner

## 2020-11-24 VITALS — BP 138/88 | HR 60 | Temp 96.2°F | Ht 64.25 in | Wt 220.6 lb

## 2020-11-24 DIAGNOSIS — Z124 Encounter for screening for malignant neoplasm of cervix: Secondary | ICD-10-CM | POA: Diagnosis not present

## 2020-11-24 DIAGNOSIS — Z85828 Personal history of other malignant neoplasm of skin: Secondary | ICD-10-CM | POA: Diagnosis not present

## 2020-11-24 DIAGNOSIS — Z1231 Encounter for screening mammogram for malignant neoplasm of breast: Secondary | ICD-10-CM | POA: Diagnosis not present

## 2020-11-24 DIAGNOSIS — Z0001 Encounter for general adult medical examination with abnormal findings: Secondary | ICD-10-CM | POA: Diagnosis not present

## 2020-11-24 DIAGNOSIS — Z9889 Other specified postprocedural states: Secondary | ICD-10-CM | POA: Diagnosis not present

## 2020-11-24 DIAGNOSIS — I10 Essential (primary) hypertension: Secondary | ICD-10-CM | POA: Diagnosis not present

## 2020-11-24 DIAGNOSIS — Z86010 Personal history of colonic polyps: Secondary | ICD-10-CM | POA: Diagnosis not present

## 2020-11-24 DIAGNOSIS — E7800 Pure hypercholesterolemia, unspecified: Secondary | ICD-10-CM

## 2020-11-24 LAB — CBC WITH DIFFERENTIAL/PLATELET
Basophils Absolute: 0.1 10*3/uL (ref 0.0–0.1)
Basophils Relative: 1.1 % (ref 0.0–3.0)
Eosinophils Absolute: 0.2 10*3/uL (ref 0.0–0.7)
Eosinophils Relative: 3.1 % (ref 0.0–5.0)
HCT: 41.3 % (ref 36.0–46.0)
Hemoglobin: 13.7 g/dL (ref 12.0–15.0)
Lymphocytes Relative: 31.4 % (ref 12.0–46.0)
Lymphs Abs: 2 10*3/uL (ref 0.7–4.0)
MCHC: 33.2 g/dL (ref 30.0–36.0)
MCV: 85.7 fl (ref 78.0–100.0)
Monocytes Absolute: 0.5 10*3/uL (ref 0.1–1.0)
Monocytes Relative: 7.1 % (ref 3.0–12.0)
Neutro Abs: 3.7 10*3/uL (ref 1.4–7.7)
Neutrophils Relative %: 57.3 % (ref 43.0–77.0)
Platelets: 321 10*3/uL (ref 150.0–400.0)
RBC: 4.82 Mil/uL (ref 3.87–5.11)
RDW: 13.8 % (ref 11.5–15.5)
WBC: 6.5 10*3/uL (ref 4.0–10.5)

## 2020-11-24 LAB — COMPREHENSIVE METABOLIC PANEL
ALT: 13 U/L (ref 0–35)
AST: 9 U/L (ref 0–37)
Albumin: 4.5 g/dL (ref 3.5–5.2)
Alkaline Phosphatase: 78 U/L (ref 39–117)
BUN: 15 mg/dL (ref 6–23)
CO2: 30 mEq/L (ref 19–32)
Calcium: 11.1 mg/dL — ABNORMAL HIGH (ref 8.4–10.5)
Chloride: 103 mEq/L (ref 96–112)
Creatinine, Ser: 0.74 mg/dL (ref 0.40–1.20)
GFR: 92.28 mL/min (ref >60.00)
Glucose, Bld: 86 mg/dL (ref 70–99)
Potassium: 4 mEq/L (ref 3.5–5.1)
Sodium: 138 mEq/L (ref 135–145)
Total Bilirubin: 0.8 mg/dL (ref 0.2–1.2)
Total Protein: 7.5 g/dL (ref 6.0–8.3)

## 2020-11-24 LAB — LIPID PANEL
Cholesterol: 223 mg/dL — ABNORMAL HIGH (ref 0–200)
HDL: 50.3 mg/dL (ref >39.00)
LDL Cholesterol: 156 mg/dL — ABNORMAL HIGH (ref 0–99)
NonHDL: 172.21
Total CHOL/HDL Ratio: 4
Triglycerides: 81 mg/dL (ref 0.0–149.0)
VLDL: 16.2 mg/dL (ref 0.0–40.0)

## 2020-11-24 LAB — TSH: TSH: 2.1 u[IU]/mL (ref 0.35–4.50)

## 2020-11-24 MED ORDER — LISINOPRIL 20 MG PO TABS: 20.0000 mg | ORAL_TABLET | Freq: Every day | ORAL | 3 refills | Status: DC

## 2020-11-24 NOTE — Patient Instructions (Signed)
Go to lab for blood draw.  You will be contacted to schedule appt with GI, dermatology and breast center.  Maintain DASH diet and regular exercise (3-4x/week, at least 51mns of cardio exercise).  Some OTC supplements that can be used to help with hot flashes: passion flower 4029mBID or Magnesium glycinate 35047maily or Primrose oil.  Preventive Care 40-74 54ars Old, Female Preventive care refers to lifestyle choices and visits with your health care provider that can promote health and wellness. This includes:  A yearly physical exam. This is also called an annual wellness visit.  Regular dental and eye exams.  Immunizations.  Screening for certain conditions.  Healthy lifestyle choices, such as: ? Eating a healthy diet. ? Getting regular exercise. ? Not using drugs or products that contain nicotine and tobacco. ? Limiting alcohol use. What can I expect for my preventive care visit? Physical exam Your health care provider will check your:  Height and weight. These may be used to calculate your BMI (body mass index). BMI is a measurement that tells if you are at a healthy weight.  Heart rate and blood pressure.  Body temperature.  Skin for abnormal spots. Counseling Your health care provider may ask you questions about your:  Past medical problems.  Family's medical history.  Alcohol, tobacco, and drug use.  Emotional well-being.  Home life and relationship well-being.  Sexual activity.  Diet, exercise, and sleep habits.  Work and work envStatisticianAccess to firearms.  Method of birth control.  Menstrual cycle.  Pregnancy history. What immunizations do I need? Vaccines are usually given at various ages, according to a schedule. Your health care provider will recommend vaccines for you based on your age, medical history, and lifestyle or other factors, such as travel or where you work.   What tests do I need? Blood tests  Lipid and cholesterol  levels. These may be checked every 5 years, or more often if you are over 50 54ars old.  Hepatitis C test.  Hepatitis B test. Screening  Lung cancer screening. You may have this screening every year starting at age 14 54 you have a 30-pack-year history of smoking and currently smoke or have quit within the past 15 years.  Colorectal cancer screening. ? All adults should have this screening starting at age 54 54d continuing until age 54.17 Your health care provider may recommend screening at age 52 59 you are at increased risk. ? You will have tests every 1-10 years, depending on your results and the type of screening test.  Diabetes screening. ? This is done by checking your blood sugar (glucose) after you have not eaten for a while (fasting). ? You may have this done every 1-3 years.  Mammogram. ? This may be done every 1-2 years. ? Talk with your health care provider about when you should start having regular mammograms. This may depend on whether you have a family history of breast cancer.  BRCA-related cancer screening. This may be done if you have a family history of breast, ovarian, tubal, or peritoneal cancers.  Pelvic exam and Pap test. ? This may be done every 3 years starting at age 54.21 Starting at age 54,54his may be done every 5 years if you have a Pap test in combination with an HPV test. ? This may be done every 3 years starting at age 54.21 Starting at age 54,54his may be done every 5 years if you have a Pap test in combination with an HPV test. Other tests  STD (sexually transmitted disease) testing, if you are at risk.  Bone density scan. This is done to screen for osteoporosis. You may have this scan  if you are at high risk for osteoporosis. Talk with your health care provider about your test results, treatment options, and if necessary, the need for more tests. Follow these instructions at home: Eating and drinking  Eat a diet that includes fresh fruits and vegetables, whole grains, lean protein, and low-fat dairy products.  Take vitamin and mineral supplements as recommended by your health care provider.  Do not  drink alcohol if: ? Your health care provider tells you not to drink. ? You are pregnant, may be pregnant, or are planning to become pregnant.  If you drink alcohol: ? Limit how much you have to 0-1 drink a day. ? Be aware of how much alcohol is in your drink. In the [place] one drink equals one 12 oz bottle of beer (355 mL), one 5 oz glass of wine (148 mL), or one 1 oz glass of hard liquor (44 mL).   Lifestyle  Take daily care of your teeth and gums. Brush your teeth every morning and night with fluoride toothpaste. Floss one time each day.  Stay active. Exercise for at least 30 minutes 5 or more days each week.  Do not use any products that contain nicotine or tobacco, such as cigarettes, e-cigarettes, and chewing tobacco. If you need help quitting, ask your health care provider.  Do not use drugs.  If you are sexually active, practice safe sex. Use a condom or other form of protection to prevent STIs (sexually transmitted infections).  If you do not wish to become pregnant, use a form of birth control. If you plan to become pregnant, see your health care provider for a prepregnancy visit.  If told by your health care provider, take low-dose aspirin daily starting at age 49.  Find healthy ways to cope with stress, such as: ? Meditation, yoga, or listening to music. ? Journaling. ? Talking to a trusted person. ? Spending time with friends and family. Safety  Always wear your seat belt while driving or riding in a vehicle.  Do not drive: ? If you have been drinking alcohol. Do not ride with someone who has been drinking. ? When you are tired or distracted. ? While texting.  Wear a helmet and other protective equipment during sports activities.  If you have firearms in your house, make sure you follow all gun safety procedures. What's next?  Visit your health care provider once a year for an annual wellness visit.  Ask your health care provider how often you should have your  eyes and teeth checked.  Stay up to date on all vaccines. This information is not intended to replace advice given to you by your health care provider. Make sure you discuss any questions you have with your health care provider. Document Revised: 06/08/2020 Document Reviewed: 05/16/2018 Elsevier Patient Education  2021 [employer].

## 2020-11-24 NOTE — Assessment & Plan Note (Signed)
Left shoulder, onset 75yrs ago, has become ulcerated and scaly Previous skin biopsy completed by [place] [facility]: positive basal cell carcinoma Referral to dermatology entered.

## 2020-11-24 NOTE — Assessment & Plan Note (Signed)
Repeat lipid panel Advised about need for regular exercise and DASH diet

## 2020-11-24 NOTE — Assessment & Plan Note (Signed)
>>  ASSESSMENT AND PLAN FOR HX OF COLONIC POLYP WRITTEN ON 11/24/2020  9:21 AM BY ,  LUM, NP  Referred to GI for repeat colonoscopy

## 2020-11-24 NOTE — Assessment & Plan Note (Signed)
Referred to GI for repeat colonoscopy

## 2020-11-24 NOTE — Progress Notes (Signed)
Subjective:    Patient ID: Tina Scott, female    DOB: 1966-04-23, 55 y.o.   MRN: [id number]  Patient presents today for CPE and eval of chronic conditions  HPI Essential hypertension BP at goal with lisinopril BP Readings from Last 3 Encounters:  11/24/20 138/88  05/25/20 126/88   Maintain current medication  Pure hypercholesterolemia Repeat lipid panel Advised about need for regular exercise and DASH diet  Hx of basal cell carcinoma excision Left shoulder, onset 25yrs ago, has become ulcerated and scaly Previous skin biopsy completed by [place] [facility]: positive basal cell carcinoma Referral to dermatology entered.  Hx of colonic polyp Referred to GI for repeat colonoscopy  Sexual History (orientation,birth control, marital status, STD):s/p tubal ligation, needs repeat PAP and mammogram, denies need for STD screen  Depression/Suicide: Depression screen [facility] 2/9 11/24/2020  Decreased Interest 0  Down, Depressed, Hopeless 1  PHQ - 2 Score 1  Altered sleeping 3  Tired, decreased energy 1  Change in appetite 0  Feeling bad or failure about yourself  1  Trouble concentrating 0  Moving slowly or fidgety/restless 0  Suicidal thoughts 0  PHQ-9 Score 6  Difficult doing work/chores Somewhat difficult   Vision:up to date  Dental:up to date  Immunizations: (TDAP, Hep C screen, Pneumovax, Influenza, zoster)  Health Maintenance  Topic Date Due  . Colon Cancer Screening  Never done  . Mammogram  Never done  . Pap Smear  09/07/2017  .  Hepatitis C: One time screening is recommended by Center for Disease Control  (CDC) for  adults born from [birth year] through 1964.   11/24/2021*  . HIV Screening  11/24/2021*  . Tetanus Vaccine  12/27/2026  . Flu Shot  Completed  . COVID-19 Vaccine  Completed  . HPV Vaccine  Aged Out  *Topic was postponed. The date shown is not the original due date.   Diet:regular Weight:  Wt Readings from Last 3 Encounters:  11/24/20 220 lb 9.6 oz  (100.1 kg)  05/25/20 220 lb 12.8 oz (100.2 kg)    Fall Risk: No flowsheet data found.  Medications and allergies reviewed with patient and updated if appropriate.  Patient Active Problem List   Diagnosis Date Noted  . Hx of basal cell carcinoma excision 11/24/2020  . Hx of colonic polyp 05/25/2020  . Pure hypercholesterolemia 05/25/2020  . Carpal tunnel syndrome on right 01/02/2017  . Essential hypertension 08/18/2016    Current Outpatient Medications on File Prior to Visit  Medication Sig Dispense Refill  . Ascorbic Acid (VITAMIN C PO) Take by mouth.    Marland Kitchen b complex vitamins tablet Take 1 tablet by mouth daily.    . Multiple Vitamin (MULTIVITAMIN ADULT PO) Take by mouth.    . Zinc Acetate, Oral, (ZINC ACETATE PO) Take by mouth.     No current facility-administered medications on file prior to visit.    Past Medical History:  Diagnosis Date  . Hypertension     Past Surgical History:  Procedure Laterality Date  . CESAREAN SECTION     x2  . HERNIA REPAIR     Hernia Revision also.   . TUBAL LIGATION      Social History   Socioeconomic History  . Marital status: Married    Spouse name: Not on file  . Number of children: 2  . Years of education: Not on file  . Highest education level: Not on file  Occupational History  . Occupation: registered Optician, dispensing: [employer]  Tobacco Use  . Smoking status: Former Research scientist (life sciences)  . Smokeless tobacco: Never Used  Vaping Use  . Vaping Use: Never used  Substance and Sexual Activity  . Alcohol use: Yes    Comment: occasionally  . Drug use: Never  . Sexual activity: Yes    Birth control/protection: Surgical  Other Topics Concern  . Not on file  Social History Narrative  . Not on file   Social Determinants of Health   Financial Resource Strain: Not on file  Food Insecurity: Not on file  Transportation Needs: Not on file  Physical Activity: Not on file  Stress: Not on file  Social Connections: Not on file     Family History  Problem Relation Age of Onset  . Hypertension Mother   . Hyperlipidemia Mother   . Osteoporosis Mother   . Cancer Maternal Grandmother 62       colon cancer       Review of Systems  Constitutional: Negative for fever, malaise/fatigue and weight loss.  HENT: Negative for congestion and sore throat.   Eyes:       Negative for visual changes  Respiratory: Negative for cough and shortness of breath.   Cardiovascular: Negative for chest pain, palpitations and leg swelling.  Gastrointestinal: Negative for blood in stool, constipation, diarrhea and heartburn.  Genitourinary: Negative for dysuria, frequency and urgency.  Musculoskeletal: Negative for falls, joint pain and myalgias.  Skin: Positive for rash.  Neurological: Negative for dizziness, sensory change and headaches.  Endo/Heme/Allergies: Does not bruise/bleed easily.  Psychiatric/Behavioral: Negative for depression, substance abuse and suicidal ideas. The patient has insomnia. The patient is not nervous/anxious.    Objective:   Vitals:   11/24/20 0808 11/24/20 0830  BP: 116/70 138/88  Pulse: 60   Temp: (!) 96.2 F (35.7 C)   SpO2: 98%    Body mass index is 37.57 kg/m.  Physical Examination:  Physical Exam Vitals reviewed. Exam conducted with a chaperone present.  Constitutional:      General: She is not in acute distress.    Appearance: She is well-developed. She is obese.  HENT:     Right Ear: Tympanic membrane, ear canal and external ear normal.     Left Ear: Tympanic membrane, ear canal and external ear normal.     Mouth/Throat:     Mouth: Oropharynx is clear and moist.  Eyes:     Extraocular Movements: Extraocular movements intact and EOM normal.     Conjunctiva/sclera: Conjunctivae normal.  Cardiovascular:     Rate and Rhythm: Normal rate and regular rhythm.     Pulses: Normal pulses.     Heart sounds: Normal heart sounds.  Pulmonary:     Effort: Pulmonary effort is normal. No  respiratory distress.     Breath sounds: Normal breath sounds.  Chest:     Chest wall: No tenderness.  Breasts:     Right: Normal. No axillary adenopathy or supraclavicular adenopathy.     Left: Normal. No axillary adenopathy or supraclavicular adenopathy.    Abdominal:     General: Bowel sounds are normal.     Palpations: Abdomen is soft.  Genitourinary:    Labia:        Right: No rash or tenderness.        Left: No rash or tenderness.      Vagina: Normal.     Cervix: No cervical motion tenderness, discharge, friability, lesion or erythema.     Uterus: Normal.  Adnexa: Right adnexa normal and left adnexa normal.     Comments: Cervical os stenosis Musculoskeletal:        General: No edema. Normal range of motion.     Cervical back: Normal range of motion and neck supple.     Right lower leg: No edema.     Left lower leg: No edema.  Lymphadenopathy:     Cervical: No cervical adenopathy.     Upper Body:     Right upper body: No supraclavicular, axillary or pectoral adenopathy.     Left upper body: No supraclavicular, axillary or pectoral adenopathy.     Lower Body: No right inguinal adenopathy. No left inguinal adenopathy.  Skin:    General: Skin is warm and dry.     Findings: Lesion present. No erythema or rash.       Neurological:     Mental Status: She is alert and oriented to person, place, and time.     Deep Tendon Reflexes: Reflexes are normal and symmetric.  Psychiatric:        Mood and Affect: Mood normal.        Behavior: Behavior normal.        Thought Content: Thought content normal.    ASSESSMENT and PLAN: This visit occurred during the SARS-CoV-2 public health emergency.  Safety protocols were in place, including screening questions prior to the visit, additional usage of staff PPE, and extensive cleaning of exam room while observing appropriate contact time as indicated for disinfecting solutions.   Tina Scott was seen today for annual exam.  Diagnoses  and all orders for this visit:  Encounter for preventative adult health care exam with abnormal findings -     MM 3D SCREEN BREAST BILATERAL; Future -     CBC with Differential/Platelet -     Comprehensive metabolic panel -     TSH -     Cytology - PAP( [place])  Essential hypertension -     lisinopril (ZESTRIL) 20 MG tablet; Take 1 tablet (20 mg total) by mouth daily.  Pure hypercholesterolemia -     Lipid panel  Hx of basal cell carcinoma excision Comments: left shoulder Orders: -     Ambulatory referral to Dermatology  Hx of colonic polyp -     Ambulatory referral to Gastroenterology  Encounter for Papanicolaou smear for cervical cancer screening -     Cytology - PAP( [place])  Breast cancer screening by mammogram -     MM 3D SCREEN BREAST BILATERAL; Future      Problem List Items Addressed This Visit      Cardiovascular and Mediastinum   Essential hypertension    BP at goal with lisinopril BP Readings from Last 3 Encounters:  11/24/20 138/88  05/25/20 126/88   Maintain current medication      Relevant Medications   lisinopril (ZESTRIL) 20 MG tablet     Other   Hx of basal cell carcinoma excision    Left shoulder, onset 107yrs ago, has become ulcerated and scaly Previous skin biopsy completed by [place] [facility]: positive basal cell carcinoma Referral to dermatology entered.      Relevant Orders   Ambulatory referral to Dermatology   Hx of colonic polyp    Referred to GI for repeat colonoscopy      Relevant Orders   Ambulatory referral to Gastroenterology   Pure hypercholesterolemia    Repeat lipid panel Advised about need for regular exercise and DASH diet  Relevant Medications   lisinopril (ZESTRIL) 20 MG tablet   Other Relevant Orders   Lipid panel    Other Visit Diagnoses    Encounter for preventative adult health care exam with abnormal findings    -  Primary   Relevant Orders   MM 3D SCREEN BREAST BILATERAL   CBC with  Differential/Platelet   Comprehensive metabolic panel   TSH   Cytology - PAP( [place])   Encounter for Papanicolaou smear for cervical cancer screening       Relevant Orders   Cytology - PAP( [place])   Breast cancer screening by mammogram       Relevant Orders   MM 3D SCREEN BREAST BILATERAL      Follow up: Return in about 6 months (around 05/27/2021) for HTN.  Wilfred Lacy, NP

## 2020-11-24 NOTE — Assessment & Plan Note (Signed)
BP at goal with lisinopril BP Readings from Last 3 Encounters:  11/24/20 138/88  05/25/20 126/88   Maintain current medication

## 2020-11-25 ENCOUNTER — Other Ambulatory Visit: Payer: 59

## 2020-11-25 LAB — CYTOLOGY - PAP
Comment: NEGATIVE
Diagnosis: NEGATIVE
High risk HPV: NEGATIVE

## 2020-11-25 NOTE — Assessment & Plan Note (Signed)
Add PTH

## 2020-11-26 LAB — PTH, INTACT AND CALCIUM
Calcium: 11.5 mg/dL — ABNORMAL HIGH (ref 8.6–10.4)
PTH: 52 pg/mL (ref 14–64)

## 2020-12-15 MED FILL — LISINOPRIL 20 MG TABLET: 20 | 90 days supply | Qty: 90 | Fill #2

## 2020-12-18 ENCOUNTER — Other Ambulatory Visit (HOSPITAL_COMMUNITY): Payer: Self-pay

## 2020-12-20 ENCOUNTER — Encounter: Payer: Self-pay | Admitting: Internal Medicine

## 2021-01-24 ENCOUNTER — Other Ambulatory Visit (HOSPITAL_COMMUNITY): Payer: Self-pay

## 2021-01-24 ENCOUNTER — Telehealth: Payer: 59 | Admitting: Emergency Medicine

## 2021-01-24 DIAGNOSIS — U071 COVID-19: Secondary | ICD-10-CM | POA: Diagnosis not present

## 2021-01-24 MED ORDER — FLUTICASONE PROPIONATE 50 MCG/ACT NA SUSP
2.0000 | Freq: Every day | NASAL | 0 refills | Status: DC
  Filled 2021-01-24: qty 16, 30d supply, fill #0

## 2021-01-24 MED ORDER — BENZONATATE 100 MG PO CAPS
100.0000 mg | ORAL_CAPSULE | Freq: Two times a day (BID) | ORAL | 0 refills | Status: DC | PRN
  Filled 2021-01-24: qty 20, 10d supply, fill #0

## 2021-01-24 NOTE — Progress Notes (Signed)
E-Visit for Positive Covid Test Result We are sorry you are not feeling well. We are here to help!  You have tested positive for COVID-19, meaning that you were infected with the novel coronavirus and could give the virus to others.  It is vitally important that you stay home so you do not spread it to others.      Please continue isolation at home, for at least 10 days since the start of your symptoms and until you have had 24 hours with no fever (without taking a fever reducer) and with improving of symptoms.  If you have no symptoms but tested positive (or all symptoms resolve after 5 days and you have no fever) you can leave your house but continue to wear a mask around others for an additional 5 days. If you have a fever,continue to stay home until you have had 24 hours of no fever. Most cases improve 5-10 days from onset but we have seen a small number of patients who have gotten worse after the 10 days.  Please be sure to watch for worsening symptoms and remain taking the proper precautions.   Go to the nearest hospital ED for assessment if fever/cough/breathlessness are severe or illness seems like a threat to life.    The following symptoms may appear 2-14 days after exposure: . Fever . Cough . Shortness of breath or difficulty breathing . Chills . Repeated shaking with chills . Muscle pain . Headache . Sore throat . New loss of taste or smell . Fatigue . Congestion or runny nose . Nausea or vomiting . Diarrhea  You have been enrolled in [facility] for COVID-19. Daily you will receive a questionnaire within the [facility] website. Our COVID-19 response team will be monitoring your responses daily.  You can use medication such as prescription for Fluticasone nasal spray 2 sprays in each nostril one time per day  And Tessalon perles for cough.  You may also take acetaminophen (Tylenol) as needed for fever.  HOME CARE: . Only take medications as instructed by your  medical team. . Drink plenty of fluids and get plenty of rest. . A steam or ultrasonic humidifier can help if you have congestion.   GET HELP RIGHT AWAY IF YOU HAVE EMERGENCY WARNING SIGNS.  Call 911 or proceed to your closest emergency facility if: . You develop worsening high fever. . Trouble breathing . Bluish lips or face . Persistent pain or pressure in the chest . New confusion . Inability to wake or stay awake . You cough up blood. . Your symptoms become more severe . Inability to hold down food or fluids  This list is not all possible symptoms. Contact your medical provider for any symptoms that are severe or concerning to you.    Your e-visit answers were reviewed by a board certified advanced clinical practitioner to complete your personal care plan.  Depending on the condition, your plan could have included both over the counter or prescription medications.  If there is a problem please reply once you have received a response from your provider.  Your safety is important to [place].  If you have drug allergies check your prescription carefully.    You can use MyChart to ask questions about today's visit, request a non-urgent call back, or ask for a work or school excuse for 24 hours related to this e-Visit. If it has been greater than 24 hours you will need to follow up with your provider, or enter  a new e-Visit to address those concerns. You will get an e-mail in the next two days asking about your experience.  I hope that your e-visit has been valuable and will speed your recovery. Thank you for using e-visits.       Approximately 5 minutes was used in reviewing the patient's chart, questionnaire, prescribing medications, and documentation.

## 2021-02-18 ENCOUNTER — Other Ambulatory Visit (HOSPITAL_COMMUNITY): Payer: Self-pay

## 2021-02-18 MED FILL — Lisinopril Tab 20 MG: ORAL | 90 days supply | Qty: 90 | Fill #0 | Status: CN

## 2021-03-08 ENCOUNTER — Other Ambulatory Visit (HOSPITAL_COMMUNITY): Payer: Self-pay

## 2021-03-08 MED FILL — Lisinopril Tab 20 MG: ORAL | 90 days supply | Qty: 90 | Fill #0 | Status: AC

## 2021-03-09 ENCOUNTER — Encounter: Payer: Self-pay | Admitting: Internal Medicine

## 2021-03-09 ENCOUNTER — Ambulatory Visit: Payer: 59 | Admitting: Internal Medicine

## 2021-03-09 ENCOUNTER — Other Ambulatory Visit: Payer: Self-pay

## 2021-03-09 VITALS — BP 122/80 | HR 60 | Ht 64.0 in | Wt 221.0 lb

## 2021-03-09 DIAGNOSIS — E213 Hyperparathyroidism, unspecified: Secondary | ICD-10-CM

## 2021-03-09 LAB — BASIC METABOLIC PANEL
BUN: 15 mg/dL (ref 6–23)
CO2: 29 mEq/L (ref 19–32)
Calcium: 11.2 mg/dL — ABNORMAL HIGH (ref 8.4–10.5)
Chloride: 106 mEq/L (ref 96–112)
Creatinine, Ser: 0.71 mg/dL (ref 0.40–1.20)
GFR: 96.78 mL/min (ref >60.00)
Glucose, Bld: 69 mg/dL — ABNORMAL LOW (ref 70–99)
Potassium: 4.4 mEq/L (ref 3.5–5.1)
Sodium: 141 mEq/L (ref 135–145)

## 2021-03-09 LAB — VITAMIN D 25 HYDROXY (VIT D DEFICIENCY, FRACTURES): VITD: 20.14 ng/mL — ABNORMAL LOW (ref 30.00–100.00)

## 2021-03-09 LAB — ALBUMIN: Albumin: 4.5 g/dL (ref 3.5–5.2)
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
## 2021-03-09 NOTE — Patient Instructions (Signed)
-   Please stay hydrated - AVOID CALCIUM SUPPLEMENTS, AVOID LOW CALCIUM DIET - Maintain normal dietary calcium intake (2-3 servings of dairy a day)     Please Contact [place] office to schedule a bone density [phone] located at [address] [employer]

## 2021-03-10 LAB — PARATHYROID HORMONE, INTACT (NO CA): PTH: 50 pg/mL (ref 16–77)

## 2021-03-15 ENCOUNTER — Telehealth: Payer: Self-pay | Admitting: Internal Medicine

## 2021-03-15 NOTE — Telephone Encounter (Signed)
Good afternoon Dr. Hilarie Fredrickson, we received a referral for patient to have a colonoscopy.  Her last colon was in 2019.  Will send records to you.  Can you please review and advise on scheduling?  Thank you.

## 2021-03-16 ENCOUNTER — Ambulatory Visit (INDEPENDENT_AMBULATORY_CARE_PROVIDER_SITE_OTHER)
Admission: RE | Admit: 2021-03-16 | Discharge: 2021-03-16 | Disposition: A | Discharge status: Home / Self Care | Payer: 59 | Source: Ambulatory Visit | Attending: Internal Medicine | Admitting: Internal Medicine

## 2021-03-16 ENCOUNTER — Other Ambulatory Visit: Payer: Self-pay

## 2021-03-16 DIAGNOSIS — E213 Hyperparathyroidism, unspecified: Secondary | ICD-10-CM

## 2021-03-17 NOTE — Telephone Encounter (Signed)
Left voicemail for patient to return call.

## 2021-03-17 NOTE — Telephone Encounter (Signed)
Colonoscopy performed for screening in May 2019 2 adenomas removed, 1 was 15 mm Okay to repeat colonoscopy for surveillance at this time [facility]

## 2021-03-25 ENCOUNTER — Encounter: Payer: Self-pay | Admitting: Internal Medicine

## 2021-04-26 ENCOUNTER — Ambulatory Visit: Payer: 59 | Admitting: Physician Assistant

## 2021-06-01 ENCOUNTER — Ambulatory Visit: Payer: 59 | Admitting: Nurse Practitioner

## 2021-06-01 ENCOUNTER — Encounter: Payer: Self-pay | Admitting: Nurse Practitioner

## 2021-06-01 ENCOUNTER — Other Ambulatory Visit: Payer: Self-pay

## 2021-06-01 DIAGNOSIS — Z23 Encounter for immunization: Secondary | ICD-10-CM | POA: Diagnosis not present

## 2021-06-01 DIAGNOSIS — E7800 Pure hypercholesterolemia, unspecified: Secondary | ICD-10-CM

## 2021-06-01 DIAGNOSIS — I10 Essential (primary) hypertension: Secondary | ICD-10-CM | POA: Diagnosis not present

## 2021-06-01 DIAGNOSIS — E213 Hyperparathyroidism, unspecified: Secondary | ICD-10-CM

## 2021-06-01 DIAGNOSIS — E559 Vitamin D deficiency, unspecified: Secondary | ICD-10-CM

## 2021-06-01 LAB — VITAMIN D 25 HYDROXY (VIT D DEFICIENCY, FRACTURES): VITD: 20.24 ng/mL — ABNORMAL LOW (ref 30.00–100.00)

## 2021-06-01 LAB — BASIC METABOLIC PANEL
BUN: 10 mg/dL (ref 6–23)
CO2: 29 mEq/L (ref 19–32)
Calcium: 10.9 mg/dL — ABNORMAL HIGH (ref 8.4–10.5)
Chloride: 105 mEq/L (ref 96–112)
Creatinine, Ser: 0.73 mg/dL (ref 0.40–1.20)
GFR: 93.46 mL/min (ref >60.00)
Glucose, Bld: 85 mg/dL (ref 70–99)
Potassium: 4.7 mEq/L (ref 3.5–5.1)
Sodium: 140 mEq/L (ref 135–145)

## 2021-06-01 LAB — LIPID PANEL
Cholesterol: 174 mg/dL (ref 0–200)
HDL: 47.3 mg/dL (ref >39.00)
LDL Cholesterol: 110 mg/dL — ABNORMAL HIGH (ref 0–99)
NonHDL: 126.39
Total CHOL/HDL Ratio: 4
Triglycerides: 80 mg/dL (ref 0.0–149.0)
VLDL: 16 mg/dL (ref 0.0–40.0)

## 2021-06-01 LAB — ALBUMIN: Albumin: 4.2 g/dL (ref 3.5–5.2)

## 2021-06-01 NOTE — Assessment & Plan Note (Addendum)
Under the care of Dr. Magda Bernheim, next appt 06/15/2021 Need to repeat PTH, BMP, Albumin and vit.D today. Denies any muscle weakness or fatigue or mood swings.

## 2021-06-01 NOTE — Progress Notes (Signed)
Subjective:  Patient ID: Tina Scott, female    DOB: 04/19/1966  Age: 55 y.o. MRN: [id number]  CC: Follow-up (6 month f/u on HTN, pt is fasting. /Pt receiving shingles vaccine today/Flu vaccine will be given at her job. )  HPI  Pure hypercholesterolemia Repeat lipid panel today: improving LDL Encouraged to maintain DASH diet and daily exercise  Essential hypertension BO at goal with lisinopril BP Readings from Last 3 Encounters:  03/09/21 122/80  11/24/20 138/88  05/25/20 126/88   Repeat BMP Maintain medication dose  Hyperparathyroidism ([place]) Under the care of Dr. Magda Bernheim, next appt 06/15/2021 Need to repeat PTH, BMP, Albumin and vit.D today. Denies any muscle weakness or fatigue or mood swings.  Wt Readings from Last 3 Encounters:  03/09/21 221 lb (100.2 kg)  11/24/20 220 lb 9.6 oz (100.1 kg)  05/25/20 220 lb 12.8 oz (100.2 kg)    Reviewed past Medical, Social and Family history today.  Outpatient Medications Prior to Visit  Medication Sig Dispense Refill   Ascorbic Acid (VITAMIN C PO) Take by mouth.     b complex vitamins tablet Take 1 tablet by mouth daily.     Cholecalciferol (VITAMIN D3 PO) Take by mouth.     lisinopril (ZESTRIL) 20 MG tablet TAKE 1 TABLET (20 MG TOTAL) BY MOUTH DAILY. 90 tablet 3   Multiple Vitamin (MULTIVITAMIN ADULT PO) Take by mouth.     Zinc Acetate, Oral, (ZINC ACETATE PO) Take by mouth.     No facility-administered medications prior to visit.    ROS See HPI  Objective:  There were no vitals taken for this visit.  Physical Exam Vitals reviewed.  Cardiovascular:     Rate and Rhythm: Normal rate and regular rhythm.     Pulses: Normal pulses.     Heart sounds: Normal heart sounds.  Pulmonary:     Effort: Pulmonary effort is normal.  Musculoskeletal:     Right lower leg: No edema.     Left lower leg: No edema.  Lymphadenopathy:     Cervical: No cervical adenopathy.  Neurological:     Mental Status: She is alert and  oriented to person, place, and time.  Psychiatric:        Mood and Affect: Mood normal.        Behavior: Behavior normal.        Thought Content: Thought content normal.    Assessment & Plan:  This visit occurred during the SARS-CoV-2 public health emergency.  Safety protocols were in place, including screening questions prior to the visit, additional usage of staff PPE, and extensive cleaning of exam room while observing appropriate contact time as indicated for disinfecting solutions.   Shamea was seen today for follow-up.  Diagnoses and all orders for this visit:  Essential hypertension -     Basic metabolic panel  Need for shingles vaccine -     Varicella-zoster vaccine IM  Hyperparathyroidism (HCC) -     Vitamin D (25 hydroxy) -     PTH, intact (no Ca) -     Albumin  Hypercalcemia -     Vitamin D (25 hydroxy) -     PTH, intact (no Ca) -     Albumin  Pure hypercholesterolemia -     Lipid panel  Vitamin D deficiency -     Vitamin D (25 hydroxy)  Problem List Items Addressed This Visit       Cardiovascular and Mediastinum   Essential hypertension - Primary  BO at goal with lisinopril BP Readings from Last 3 Encounters:  03/09/21 122/80  11/24/20 138/88  05/25/20 126/88   Repeat BMP Maintain medication dose      Relevant Orders   Basic metabolic panel (Completed)     Endocrine   Hyperparathyroidism ([place])    Under the care of Dr. Magda Bernheim, next appt 06/15/2021 Need to repeat PTH, BMP, Albumin and vit.D today. Denies any muscle weakness or fatigue or mood swings.      Relevant Orders   Vitamin D (25 hydroxy) (Completed)   PTH, intact (no Ca)   Albumin (Completed)     Other   Hypercalcemia   Relevant Orders   Vitamin D (25 hydroxy) (Completed)   PTH, intact (no Ca)   Albumin (Completed)   Pure hypercholesterolemia    Repeat lipid panel today: improving LDL Encouraged to maintain DASH diet and daily exercise      Relevant Orders   Lipid  panel (Completed)   Other Visit Diagnoses     Need for shingles vaccine       Relevant Orders   Varicella-zoster vaccine IM (Completed)   Vitamin D deficiency       Relevant Orders   Vitamin D (25 hydroxy) (Completed)       Follow-up: Return in about 6 months (around 11/29/2021) for CPE (fasting).  Wilfred Lacy, NP

## 2021-06-01 NOTE — Patient Instructions (Signed)
Maintain current medications  Go to lab for blood draw. Will forward results to endocrinology  Maintain appt with endocrinology and GI

## 2021-06-01 NOTE — Assessment & Plan Note (Signed)
BO at goal with lisinopril BP Readings from Last 3 Encounters:  03/09/21 122/80  11/24/20 138/88  05/25/20 126/88   Repeat BMP Maintain medication dose

## 2021-06-01 NOTE — Assessment & Plan Note (Addendum)
Repeat lipid panel today: improving LDL Encouraged to maintain DASH diet and daily exercise

## 2021-06-02 LAB — PARATHYROID HORMONE, INTACT (NO CA): PTH: 72 pg/mL (ref 16–77)

## 2021-06-10 ENCOUNTER — Telehealth: Payer: Self-pay | Admitting: Internal Medicine

## 2021-06-10 NOTE — Telephone Encounter (Signed)
Patient notified

## 2021-06-10 NOTE — Telephone Encounter (Signed)
Vm left for patient to callback. She will need to have her vitamin D recheck and it will need to be normal before the urine is collected.

## 2021-06-10 NOTE — Telephone Encounter (Signed)
Patient called to find out if she still needs to do a 24 hour urine capture before her visit next Wednesday - please call patient at [phone] to advise  Thanks!

## 2021-06-14 MED FILL — Lisinopril Tab 20 MG: ORAL | 90 days supply | Qty: 90 | Fill #1 | Status: AC

## 2021-06-15 ENCOUNTER — Encounter: Payer: Self-pay | Admitting: Internal Medicine

## 2021-06-15 ENCOUNTER — Other Ambulatory Visit (HOSPITAL_COMMUNITY): Payer: Self-pay

## 2021-06-15 ENCOUNTER — Other Ambulatory Visit: Payer: Self-pay

## 2021-06-15 ENCOUNTER — Ambulatory Visit: Payer: 59 | Admitting: Internal Medicine

## 2021-06-15 VITALS — BP 124/82 | HR 60 | Ht 64.0 in | Wt 221.4 lb

## 2021-06-15 DIAGNOSIS — E213 Hyperparathyroidism, unspecified: Secondary | ICD-10-CM | POA: Diagnosis not present

## 2021-06-15 DIAGNOSIS — E559 Vitamin D deficiency, unspecified: Secondary | ICD-10-CM | POA: Diagnosis not present

## 2021-06-15 NOTE — Patient Instructions (Addendum)
-   Please stay hydrated - AVOID CALCIUM SUPPLEMENTS, AVOID LOW CALCIUM DIET - Maintain normal dietary calcium intake (2-3 servings of dairy a day) - INcrease Vitamin D 4000 iu daily
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
## 2021-06-16 DIAGNOSIS — E559 Vitamin D deficiency, unspecified: Secondary | ICD-10-CM | POA: Insufficient documentation

## 2021-06-28 ENCOUNTER — Other Ambulatory Visit (HOSPITAL_COMMUNITY): Payer: Self-pay

## 2021-06-28 ENCOUNTER — Ambulatory Visit (AMBULATORY_SURGERY_CENTER): Payer: 59 | Admitting: *Deleted

## 2021-06-28 ENCOUNTER — Other Ambulatory Visit: Payer: Self-pay

## 2021-06-28 ENCOUNTER — Encounter: Payer: Self-pay | Admitting: Internal Medicine

## 2021-06-28 VITALS — Ht 64.0 in | Wt 220.0 lb

## 2021-06-28 DIAGNOSIS — Z86010 Personal history of colonic polyps: Secondary | ICD-10-CM

## 2021-06-28 MED ORDER — NA SULFATE-K SULFATE-MG SULF 17.5-3.13-1.6 GM/177ML PO SOLN
1.0000 | ORAL | 0 refills | Status: DC
  Filled 2021-06-28: qty 354, 1d supply, fill #0
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
## 2021-07-04 ENCOUNTER — Other Ambulatory Visit (HOSPITAL_COMMUNITY): Payer: Self-pay

## 2021-07-12 ENCOUNTER — Ambulatory Visit (AMBULATORY_SURGERY_CENTER): Payer: 59 | Admitting: Internal Medicine

## 2021-07-12 ENCOUNTER — Encounter: Payer: Self-pay | Admitting: Internal Medicine

## 2021-07-12 VITALS — BP 137/75 | HR 43 | Temp 97.3°F | Resp 10 | Ht 64.0 in | Wt 220.0 lb

## 2021-07-12 DIAGNOSIS — D125 Benign neoplasm of sigmoid colon: Secondary | ICD-10-CM | POA: Diagnosis not present

## 2021-07-12 DIAGNOSIS — D120 Benign neoplasm of cecum: Secondary | ICD-10-CM | POA: Diagnosis not present

## 2021-07-12 DIAGNOSIS — I10 Essential (primary) hypertension: Secondary | ICD-10-CM | POA: Diagnosis not present

## 2021-07-12 DIAGNOSIS — K5140 Inflammatory polyps of colon without complications: Secondary | ICD-10-CM

## 2021-07-12 DIAGNOSIS — Z86010 Personal history of colonic polyps: Secondary | ICD-10-CM

## 2021-07-12 DIAGNOSIS — Z1211 Encounter for screening for malignant neoplasm of colon: Secondary | ICD-10-CM | POA: Diagnosis not present

## 2021-07-12 MED ORDER — SODIUM CHLORIDE 0.9 % IV SOLN: 500.0000 mL | Freq: Once | INTRAVENOUS | Status: DC

## 2021-07-12 NOTE — Patient Instructions (Signed)
Information on polyps and diverticulosis given to you today.  Await pathology results.  Resume previous diet and medications.  YOU HAD AN ENDOSCOPIC PROCEDURE TODAY AT THE [facility]:   Refer to the procedure report that was given to you for any specific questions about what was found during the examination.  If the procedure report does not answer your questions, please call your gastroenterologist to clarify.  If you requested that your care partner not be given the details of your procedure findings, then the procedure report has been included in a sealed envelope for you to review at your convenience later.  YOU SHOULD EXPECT: Some feelings of bloating in the abdomen. Passage of more gas than usual.  Walking can help get rid of the air that was put into your GI tract during the procedure and reduce the bloating. If you had a lower endoscopy (such as a colonoscopy or flexible sigmoidoscopy) you may notice spotting of blood in your stool or on the toilet paper. If you underwent a bowel prep for your procedure, you may not have a normal bowel movement for a few days.  Please Note:  You might notice some irritation and congestion in your nose or some drainage.  This is from the oxygen used during your procedure.  There is no need for concern and it should clear up in a day or so.  SYMPTOMS TO REPORT IMMEDIATELY:   Following lower endoscopy (colonoscopy or flexible sigmoidoscopy):  Excessive amounts of blood in the stool  Significant tenderness or worsening of abdominal pains  Swelling of the abdomen that is new, acute  Fever of 100F or higher   For urgent or emergent issues, a gastroenterologist can be reached at any hour by calling [phone]. Do not use MyChart messaging for urgent concerns.    DIET:  We do recommend a small meal at first, but then you may proceed to your regular diet.  Drink plenty of fluids but you should avoid alcoholic beverages for 24  hours.  ACTIVITY:  You should plan to take it easy for the rest of today and you should NOT DRIVE or use heavy machinery until tomorrow (because of the sedation medicines used during the test).    FOLLOW UP: Our staff will call the number listed on your records 48-72 hours following your procedure to check on you and address any questions or concerns that you may have regarding the information given to you following your procedure. If we do not reach you, we will leave a message.  We will attempt to reach you two times.  During this call, we will ask if you have developed any symptoms of COVID 19. If you develop any symptoms (ie: fever, flu-like symptoms, shortness of breath, cough etc.) before then, please call [phone].  If you test positive for Covid 19 in the 2 weeks post procedure, please call and report this information to us.    If any biopsies were taken you will be contacted by phone or by letter within the next 1-3 weeks.  Please call us at [phone] if you have not heard about the biopsies in 3 weeks.    SIGNATURES/CONFIDENTIALITY: You and/or your care partner have signed paperwork which will be entered into your electronic medical record.  These signatures attest to the fact that that the information above on your After Visit Summary has been reviewed and is understood.  Full responsibility of the confidentiality of this discharge information lies with you and/or   your care-partner. 

## 2021-07-12 NOTE — Progress Notes (Signed)
Called to room to assist during endoscopic procedure.  Patient ID and intended procedure confirmed with present staff. Received instructions for my participation in the procedure from the performing physician.  

## 2021-07-12 NOTE — Progress Notes (Signed)
Report given to PACU, vss 

## 2021-07-12 NOTE — Progress Notes (Signed)
GASTROENTEROLOGY PROCEDURE H&P NOTE   Primary Care Physician: Flossie Buffy, NP    Reason for Procedure:  History of adenomatous colon polyps, including one greater than 1 cm removed at last colonoscopy in May 2019  Plan:    Colonoscopy  Patient is appropriate for endoscopic procedure(s) in the ambulatory ([facility]) setting.  The nature of the procedure, as well as the risks, benefits, and alternatives were carefully and thoroughly reviewed with the patient. Ample time for discussion and questions allowed. The patient understood, was satisfied, and agreed to proceed.     HPI: Tina Scott is a 55 y.o. female who presents for surveillance colonoscopy.  Last colonoscopy May 2019.  Tolerated the prep.  No specific complaints today including chest pain or shortness of breath.  Patient's maternal grandmother had colon cancer late in life around age 13.  Both her father and mother have had colon polyps.  Past Medical History:  Diagnosis Date   Adenomatous polyps    Hypertension     Past Surgical History:  Procedure Laterality Date   CESAREAN SECTION     x2   COLONOSCOPY     HERNIA REPAIR     Hernia Revision also.    POLYPECTOMY     TUBAL LIGATION      Prior to Admission medications   Medication Sig Start Date End Date Taking? Authorizing Provider  Ascorbic Acid (VITAMIN C PO) Take by mouth.   Yes [provider]  b complex vitamins tablet Take 1 tablet by mouth daily.   Yes [provider]  Cholecalciferol (VITAMIN D3 PO) Take by mouth.   Yes [provider]  lisinopril (ZESTRIL) 20 MG tablet TAKE 1 TABLET (20 MG TOTAL) BY MOUTH DAILY. 11/24/20 11/24/21 Yes Nche, Charlene Brooke, NP  Zinc Acetate, Oral, (ZINC ACETATE PO) Take by mouth.   Yes [provider]    Current Outpatient Medications  Medication Sig Dispense Refill   Ascorbic Acid (VITAMIN C PO) Take by mouth.     b complex vitamins tablet Take 1 tablet by mouth daily.      Cholecalciferol (VITAMIN D3 PO) Take by mouth.     lisinopril (ZESTRIL) 20 MG tablet TAKE 1 TABLET (20 MG TOTAL) BY MOUTH DAILY. 90 tablet 3   Zinc Acetate, Oral, (ZINC ACETATE PO) Take by mouth.     Current Facility-Administered Medications  Medication Dose Route Frequency Provider Last Rate Last Admin   0.9 %  sodium chloride infusion  500 mL Intravenous Once , Lajuan Lines, MD        Allergies as of 07/12/2021 - Review Complete 07/12/2021  Allergen Reaction Noted   Sulfa antibiotics Rash 08/18/2016    Family History  Problem Relation Age of Onset   Colon polyps Mother    Hypertension Mother    Hyperlipidemia Mother    Osteoporosis Mother    Colon polyps Father    Colon cancer Maternal Grandmother    Cancer Maternal Grandmother 58       colon cancer   Esophageal cancer Neg Hx    Stomach cancer Neg Hx    Rectal cancer Neg Hx     Social History   Socioeconomic History   Marital status: Married    Spouse name: Not on file   Number of children: 2   Years of education: Not on file   Highest education level: Not on file  Occupational History   Occupation: Equities trader    Employer: [employer]  Use   Smoking status: Former   Smokeless tobacco: Never  Scientific laboratory technician Use: Never used  Substance and Sexual Activity   Alcohol use: Yes    Comment: occasionally   Drug use: Never   Sexual activity: Yes    Birth control/protection: Surgical  Other Topics Concern   Not on file  Social History Narrative   Not on file   Social Determinants of Health   Financial Resource Strain: Not on file  Food Insecurity: Not on file  Transportation Needs: Not on file  Physical Activity: Not on file  Stress: Not on file  Social Connections: Not on file  Intimate Partner Violence: Not on file    Physical Exam: Vital signs in last 24 hours: @BP  133/68   Pulse 61   Temp (!) 97.3 F (36.3 C) (Temporal)   Ht 5\' 4"  (1.626 m)   Wt 220 lb (99.8 kg)   SpO2 100%    BMI 37.76 kg/m  GEN: NAD EYE: Sclerae anicteric ENT: MMM CV: Non-tachycardic Pulm: CTA b/l GI: Soft, NT/ND NEURO:  Alert & Oriented x 3   Zenovia Jarred, MD [facility]  07/12/2021 11:22 AM

## 2021-07-12 NOTE — Op Note (Signed)
[facility] Patient Name: Tina Scott Procedure Date: 07/12/2021 11:25 AM MRN: [id number] Endoscopist: Jerene Bears , MD Age: 55 Referring MD:  Date of Birth: 03/16/1966 Gender: Female Account #: [id number] Procedure:                Colonoscopy Indications:              High risk colon cancer surveillance: Personal                            history of adenomas (including one10 mm or greater                            in size), Last colonoscopy: May 2019 Medicines:                Monitored Anesthesia Care Procedure:                Pre-Anesthesia Assessment:                           - Prior to the procedure, a History and Physical                            was performed, and patient medications and                            allergies were reviewed. The patient's tolerance of                            previous anesthesia was also reviewed. The risks                            and benefits of the procedure and the sedation                            options and risks were discussed with the patient.                            All questions were answered, and informed consent                            was obtained. Prior Anticoagulants: The patient has                            taken no previous anticoagulant or antiplatelet                            agents. ASA Grade Assessment: II - A patient with                            mild systemic disease. After reviewing the risks                            and benefits, the patient was deemed in  satisfactory condition to undergo the procedure.                           After obtaining informed consent, the colonoscope                            was passed under direct vision. Throughout the                            procedure, the patient's blood pressure, pulse, and                            oxygen saturations were monitored continuously. The                            Olympus PCF-H190DL  ([id number]) Colonoscope was                            introduced through the anus and advanced to the                            terminal ileum. The colonoscopy was performed                            without difficulty. The patient tolerated the                            procedure well. The quality of the bowel                            preparation was good. The terminal ileum, ileocecal                            valve, appendiceal orifice, and rectum were                            photographed. Scope In: 11:34:03 AM Scope Out: 11:49:53 AM Scope Withdrawal Time: 0 hours 13 minutes 33 seconds  Total Procedure Duration: 0 hours 15 minutes 50 seconds  Findings:                 The digital rectal exam was normal.                           The terminal ileum appeared normal.                           A 2 mm polyp was found in the ileocecal valve. The                            polyp was sessile. The polyp was removed with a                            cold biopsy forceps. Resection and retrieval were  complete.                           A 6 mm polyp was found in the sigmoid colon. The                            polyp was sessile. The polyp was removed with a                            cold snare. Resection and retrieval were complete.                           The exam was otherwise without abnormality on                            direct and retroflexion views. Complications:            No immediate complications. Estimated Blood Loss:     Estimated blood loss was minimal. Impression:               - The examined portion of the ileum was normal.                           - One 2 mm polyp at the ileocecal valve, removed                            with a cold biopsy forceps. Resected and retrieved.                           - One 6 mm polyp in the sigmoid colon, removed with                            a cold snare. Resected and retrieved.                            - The examination was otherwise normal on direct                            and retroflexion views. Recommendation:           - Patient has a contact number available for                            emergencies. The signs and symptoms of potential                            delayed complications were discussed with the                            patient. Return to normal activities tomorrow.                            Written discharge instructions were provided to the  patient.                           - Resume previous diet.                           - Continue present medications.                           - Await pathology results.                           - Repeat colonoscopy is recommended for                            surveillance. The colonoscopy date will be                            determined after pathology results from today's                            exam become available for review. Jerene Bears, MD 07/12/2021 11:52:27 AM This report has been signed electronically.

## 2021-07-12 NOTE — Progress Notes (Signed)
Pt's states no medical or surgical changes since previsit or office visit. 

## 2021-07-14 ENCOUNTER — Telehealth: Payer: Self-pay

## 2021-07-14 NOTE — Telephone Encounter (Signed)
Called [phone] and left a message we tried to reach pt for a follow up call. maw

## 2021-07-14 NOTE — Telephone Encounter (Signed)
  Follow up Call-  Call back number 07/12/2021  Post procedure Call Back phone  # [phone]  Permission to leave phone message Yes  Some recent data might be hidden     Patient questions:  Do you have a fever, pain , or abdominal swelling? No. Pain Score  0 *  Have you tolerated food without any problems? Yes.    Have you been able to return to your normal activities? Yes.    Do you have any questions about your discharge instructions: Diet   No. Medications  No. Follow up visit  No.  Do you have questions or concerns about your Care? No.  Actions: * If pain score is 4 or above: No action needed, pain <4.  Have you developed a fever since your procedure? no  2.   Have you had an respiratory symptoms (SOB or cough) since your procedure? no  3.   Have you tested positive for COVID 19 since your procedure   4.   Have you had any family members/close contacts diagnosed with the COVID 19 since your procedure?  no   If yes to any of these questions please route to Joylene John, RN and Joella Prince, RN

## 2021-07-19 ENCOUNTER — Encounter: Payer: Self-pay | Admitting: Internal Medicine

## 2021-09-09 ENCOUNTER — Other Ambulatory Visit (HOSPITAL_COMMUNITY): Payer: Self-pay

## 2021-09-09 MED FILL — Lisinopril Tab 20 MG: ORAL | 90 days supply | Qty: 90 | Fill #2 | Status: AC

## 2021-09-21 ENCOUNTER — Other Ambulatory Visit: Payer: Self-pay

## 2021-09-21 ENCOUNTER — Ambulatory Visit (INDEPENDENT_AMBULATORY_CARE_PROVIDER_SITE_OTHER): Payer: 59 | Admitting: Physician Assistant

## 2021-09-21 DIAGNOSIS — L814 Other melanin hyperpigmentation: Secondary | ICD-10-CM

## 2021-09-21 DIAGNOSIS — C4491 Basal cell carcinoma of skin, unspecified: Secondary | ICD-10-CM

## 2021-09-21 DIAGNOSIS — C44619 Basal cell carcinoma of skin of left upper limb, including shoulder: Secondary | ICD-10-CM | POA: Diagnosis not present

## 2021-09-21 DIAGNOSIS — Z85828 Personal history of other malignant neoplasm of skin: Secondary | ICD-10-CM | POA: Diagnosis not present

## 2021-09-21 DIAGNOSIS — D485 Neoplasm of uncertain behavior of skin: Secondary | ICD-10-CM

## 2021-09-21 HISTORY — DX: Basal cell carcinoma of skin, unspecified: C44.91

## 2021-09-21 NOTE — Patient Instructions (Signed)

## 2021-09-26 ENCOUNTER — Encounter: Payer: Self-pay | Admitting: Physician Assistant

## 2021-09-26 NOTE — Progress Notes (Signed)
° °  New Patient   Subjective  Tina Scott is a 56 y.o. female who presents for the following: New Patient (Initial Visit) (Here to get re established with a dermatologist. Was being seen in [place] [facility]). [place] a biopsy in 2019 was a skin cancer. (Non mole skin cancer) Didn't have follow up because of covid. Saw PCP and she said it was scar tissue. But it has not healed. /NO other concerns. ).   The following portions of the chart were reviewed this encounter and updated as appropriate:  Tobacco   Allergies   Meds   Problems   Med Hx   Surg Hx   Fam Hx       Objective  Well appearing patient in no apparent distress; mood and affect are within normal limits.  A full examination was performed including scalp, head, eyes, ears, nose, lips, neck, chest, axillae, abdomen, back, buttocks, bilateral upper extremities, bilateral lower extremities, hands, feet, fingers, toes, fingernails, and toenails. All findings within normal limits unless otherwise noted below.  Left Shoulder - Anterior Pearly papule with telangectasia. Lesion size 3cm        Assessment & Plan  Neoplasm of uncertain behavior of skin Left Shoulder - Anterior  Skin / nail biopsy Type of biopsy: tangential   Informed consent: discussed and consent obtained   Timeout: patient name, date of birth, surgical site, and procedure verified   Procedure prep:  Patient was prepped and draped in usual sterile fashion (Non sterile) Prep type:  Chlorhexidine Anesthesia: the lesion was anesthetized in a standard fashion   Anesthetic:  1% lidocaine w/ epinephrine 1-100,000 local infiltration Instrument used: flexible razor blade   Hemostasis achieved with: aluminum chloride and electrodesiccation   Outcome: patient tolerated procedure well   Post-procedure details: sterile dressing applied and wound care instructions given   Dressing type: bandage and pressure dressing    Specimen 1 - Surgical  pathology Differential Diagnosis: R/O BCC vs SCC - cautery after biopsy  Check Margins: yes   No atypical nevi noted at the time of the visit.  I, ,, PA-C, have reviewed all documentation's for this visit.  The documentation on 09/26/21 for the exam, diagnosis, procedures and orders are all accurate and complete.

## 2021-09-27 ENCOUNTER — Telehealth: Payer: Self-pay

## 2021-09-27 NOTE — Telephone Encounter (Signed)
Phone call to patient with her pathology results. Voicemail left for patient to give the office a call back.  

## 2021-09-27 NOTE — Telephone Encounter (Signed)
-----   Message from Warren Danes, [place] sent at 09/27/2021  2:17 PM EST ----- mohs

## 2021-09-29 ENCOUNTER — Telehealth: Payer: Self-pay | Admitting: *Deleted

## 2021-09-29 NOTE — Telephone Encounter (Signed)
Mohs information sent via proficient to skin surgery center.

## 2021-09-29 NOTE — Telephone Encounter (Signed)
-----   Message from Warren Danes, [place] sent at 09/27/2021  2:17 PM EST ----- mohs

## 2021-09-29 NOTE — Telephone Encounter (Signed)
Phone call to patient with her pathology results. Path to patient.
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
## 2021-10-19 ENCOUNTER — Other Ambulatory Visit: Payer: Self-pay

## 2021-10-19 ENCOUNTER — Ambulatory Visit
Admission: RE | Admit: 2021-10-19 | Discharge: 2021-10-19 | Disposition: A | Discharge status: Home / Self Care | Payer: 59 | Source: Ambulatory Visit | Attending: Internal Medicine | Admitting: Internal Medicine

## 2021-10-19 ENCOUNTER — Encounter: Payer: Self-pay | Admitting: Internal Medicine

## 2021-10-19 ENCOUNTER — Ambulatory Visit: Payer: 59 | Admitting: Internal Medicine

## 2021-10-19 VITALS — BP 126/82 | HR 67 | Ht 64.0 in | Wt 219.0 lb

## 2021-10-19 DIAGNOSIS — E559 Vitamin D deficiency, unspecified: Secondary | ICD-10-CM

## 2021-10-19 DIAGNOSIS — E213 Hyperparathyroidism, unspecified: Secondary | ICD-10-CM

## 2021-10-19 LAB — VITAMIN D 25 HYDROXY (VIT D DEFICIENCY, FRACTURES): VITD: 41.18 ng/mL (ref 30.00–100.00)

## 2021-10-19 LAB — BASIC METABOLIC PANEL
BUN: 11 mg/dL (ref 6–23)
CO2: 31 mEq/L (ref 19–32)
Calcium: 10.8 mg/dL — ABNORMAL HIGH (ref 8.4–10.5)
Chloride: 106 mEq/L (ref 96–112)
Creatinine, Ser: 0.72 mg/dL (ref 0.40–1.20)
GFR: 94.76 mL/min (ref >60.00)
Glucose, Bld: 80 mg/dL (ref 70–99)
Potassium: 4.1 mEq/L (ref 3.5–5.1)
Sodium: 141 mEq/L (ref 135–145)

## 2021-10-19 LAB — ALBUMIN: Albumin: 4.4 g/dL (ref 3.5–5.2)

## 2021-10-19 NOTE — Patient Instructions (Signed)
-   Please stay hydrated - AVOID CALCIUM SUPPLEMENTS, AVOID LOW CALCIUM DIET - Maintain normal dietary calcium intake (2-3 servings of dairy a day) - Continue Vitamin D 4000 iu daily for now  

## 2021-10-20 LAB — PARATHYROID HORMONE, INTACT (NO CA): PTH: 75 pg/mL (ref 16–77)

## 2021-11-23 ENCOUNTER — Other Ambulatory Visit: Payer: Self-pay

## 2021-11-23 ENCOUNTER — Other Ambulatory Visit: Payer: 59

## 2021-11-23 DIAGNOSIS — E213 Hyperparathyroidism, unspecified: Secondary | ICD-10-CM

## 2021-11-24 LAB — CREATININE, URINE, 24 HOUR: Creatinine, 24H Ur: 0.75 g/(24.h) (ref 0.50–2.15)

## 2021-11-24 LAB — CALCIUM, URINE, 24 HOUR: Calcium, 24H Urine: 210 mg/24 h

## 2021-11-25 ENCOUNTER — Other Ambulatory Visit: Payer: Self-pay

## 2021-11-25 NOTE — Progress Notes (Unsigned)
Total volume 1750. 11-22-2021 at 6:45 am  ended 11-23-2021 7:00 am. ?

## 2021-11-30 ENCOUNTER — Other Ambulatory Visit (HOSPITAL_COMMUNITY): Payer: Self-pay

## 2021-11-30 ENCOUNTER — Other Ambulatory Visit: Payer: Self-pay

## 2021-11-30 ENCOUNTER — Ambulatory Visit (INDEPENDENT_AMBULATORY_CARE_PROVIDER_SITE_OTHER): Payer: 59 | Admitting: Nurse Practitioner

## 2021-11-30 ENCOUNTER — Encounter: Payer: Self-pay | Admitting: Nurse Practitioner

## 2021-11-30 VITALS — BP 136/82 | HR 66 | Temp 97.7°F | Ht 64.25 in | Wt 223.0 lb

## 2021-11-30 DIAGNOSIS — Z0001 Encounter for general adult medical examination with abnormal findings: Secondary | ICD-10-CM | POA: Diagnosis not present

## 2021-11-30 DIAGNOSIS — I10 Essential (primary) hypertension: Secondary | ICD-10-CM

## 2021-11-30 DIAGNOSIS — E7800 Pure hypercholesterolemia, unspecified: Secondary | ICD-10-CM | POA: Diagnosis not present

## 2021-11-30 DIAGNOSIS — K5140 Inflammatory polyps of colon without complications: Secondary | ICD-10-CM | POA: Diagnosis not present

## 2021-11-30 DIAGNOSIS — Z23 Encounter for immunization: Secondary | ICD-10-CM | POA: Diagnosis not present

## 2021-11-30 DIAGNOSIS — Z1231 Encounter for screening mammogram for malignant neoplasm of breast: Secondary | ICD-10-CM | POA: Diagnosis not present

## 2021-11-30 LAB — CBC
HCT: 38.4 % (ref 36.0–46.0)
Hemoglobin: 12.6 g/dL (ref 12.0–15.0)
MCHC: 32.8 g/dL (ref 30.0–36.0)
MCV: 86.1 fl (ref 78.0–100.0)
Platelets: 308 10*3/uL (ref 150.0–400.0)
RBC: 4.46 Mil/uL (ref 3.87–5.11)
RDW: 13.6 % (ref 11.5–15.5)
WBC: 6.6 10*3/uL (ref 4.0–10.5)

## 2021-11-30 LAB — LIPID PANEL
Cholesterol: 196 mg/dL (ref 0–200)
HDL: 45.7 mg/dL (ref >39.00)
LDL Cholesterol: 137 mg/dL — ABNORMAL HIGH (ref 0–99)
NonHDL: 150.15
Total CHOL/HDL Ratio: 4
Triglycerides: 64 mg/dL (ref 0.0–149.0)
VLDL: 12.8 mg/dL (ref 0.0–40.0)

## 2021-11-30 MED ORDER — LISINOPRIL 20 MG PO TABS
20.0000 mg | ORAL_TABLET | Freq: Every day | ORAL | 3 refills | Status: DC
  Filled 2021-11-30: qty 90, 90d supply, fill #0

## 2021-11-30 NOTE — Assessment & Plan Note (Signed)
Repeat lipid panel ?

## 2021-11-30 NOTE — Assessment & Plan Note (Addendum)
Repeated BP 136/82 ?BP at goal today ?No adverse effects with lisinopril ?BP Readings from Last 3 Encounters:  ?11/30/21 136/82  ?10/19/21 126/82  ?07/12/21 137/75  ? ?Maintain med dose, refill sent ?Monitor BP at home 2-3x/week ?Maintain DASH diet ?F/up in 25month ?

## 2021-11-30 NOTE — Progress Notes (Signed)
? ?Subjective:  ? ? Patient ID: Tina Scott, female    DOB: 28-Apr-1966, 56 y.o.   MRN: [id number] ? ?Patient presents today for CPE (establish patient)  ?HPI ?Essential hypertension ?Repeated BP 136/82 ?BP at goal today ?No adverse effects with lisinopril ?BP Readings from Last 3 Encounters:  ?11/30/21 136/82  ?10/19/21 126/82  ?07/12/21 137/75  ? ?Maintain med dose ?Monitor BP at home 2-3x/week ?Maintain DASH diet ?F/up in 44month ? ?Pseudopolyposis of colon without complication, unspecified part of colon ([place] ?Last colonoscopy 2023, repeat in 89yr?No melena or hematochezia or ABD pain  ?BP Readings from Last 3 Encounters:  ?11/30/21 136/82  ?10/19/21 126/82  ?07/12/21 137/75  ?  ?Vision:up to date ?Dental:up to date ?Exercise:walking daily, counting steps ?Weight:  ?Wt Readings from Last 3 Encounters:  ?11/30/21 223 lb (101.2 kg)  ?10/19/21 219 lb (99.3 kg)  ?07/12/21 220 lb (99.8 kg)  ?  ?Sexual History: ?Mammogram: will schedule ?Colon Cancer Screen: up to date ? ?Depression/Suicide: ?[facility]/9 11/30/2021 11/24/2020  ?Decreased Interest 1 0  ?Down, Depressed, Hopeless 1 1  ?PHQ - 2 Score 2 1  ?Altered sleeping 1 3  ?Tired, decreased energy 1 1  ?Change in appetite 0 0  ?Feeling bad or failure about yourself  0 1  ?Trouble concentrating 0 0  ?Moving slowly or fidgety/restless 0 0  ?Suicidal thoughts 0 0  ?PHQ-9 Score 4 6  ?Difficult doing work/chores Not difficult at all Somewhat difficult  ? ?Immunizations: (TDAP, Hep C screen, Pneumovax, Influenza, zoster)  ?Health Maintenance  ?Topic Date Due  ? Mammogram  Never done  ? COVID-19 Vaccine (4 - Booster for Pfizer series) 10/15/2020  ? Hepatitis C Screening: USPSTF Recommendation to screen - Ages 18-79 yo.  12/01/2022*  ? HIV Screening  12/01/2022*  ? Pap Smear  11/25/2023  ? Colon Cancer Screening  10/20/2026  ? Tetanus Vaccine  12/27/2026  ? Flu Shot  Completed  ? Zoster (Shingles) Vaccine  Completed  ? HPV Vaccine  Aged Out  ?*Topic was  postponed. The date shown is not the original due date.  ? ?Fall Risk: ?Fall Risk  06/01/2021  ?Falls in the past year? 0  ?Number falls in past yr: 0  ?Injury with Fall? 0  ?Risk for fall due to : No Fall Risks  ?Follow up Falls evaluation completed  ? ?Medications and allergies reviewed with patient and updated if appropriate. ? ?Patient Active Problem List  ? Diagnosis Date Noted  ? Pseudopolyposis of colon without complication, unspecified part of colon ([place]/15/2023  ? Vitamin D insufficiency 06/16/2021  ? Hyperparathyroidism ([place]06/22/2022  ? Hypercalcemia 11/25/2020  ? Hx of basal cell carcinoma excision 11/24/2020  ? Hx of colonic polyp 05/25/2020  ? Pure hypercholesterolemia 05/25/2020  ? Carpal tunnel syndrome on right 01/02/2017  ? Essential hypertension 08/18/2016  ? ? ?Current Outpatient Medications on File Prior to Visit  ?Medication Sig Dispense Refill  ? Ascorbic Acid (VITAMIN C PO) Take by mouth.    ? b complex vitamins tablet Take 1 tablet by mouth daily.    ? Cholecalciferol (VITAMIN D3 PO) Take by mouth.    ? lisinopril (ZESTRIL) 20 MG tablet TAKE 1 TABLET (20 MG TOTAL) BY MOUTH DAILY. 90 tablet 3  ? Zinc Acetate, Oral, (ZINC ACETATE PO) Take by mouth.    ? ?Current Facility-Administered Medications on File Prior to Visit  ?Medication Dose Route Frequency Provider Last Rate Last Admin  ? 0.9 %  sodium chloride infusion  500 mL Intravenous Once Pyrtle, Lajuan Lines, MD      ? ? ?Past Medical History:  ?Diagnosis Date  ? Adenomatous polyps   ? Hypertension   ? 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 Left Shoulder - anterior  ? ?Past Surgical History:  ?Procedure Laterality Date  ? CESAREAN SECTION    ? x2  ? COLONOSCOPY    ? HERNIA REPAIR    ? Hernia Revision also.   ? POLYPECTOMY    ? TUBAL LIGATION    ? ? ?Social History  ? ?Socioeconomic History  ? Marital status: Married  ?  Spouse name: Not on file  ? Number of children: 2  ? Years of education: Not on file  ? Highest education  level: Not on file  ?Occupational History  ? Occupation: Equities trader  ?  Employer: [employer]  ?Tobacco Use  ? Smoking status: Former  ? Smokeless tobacco: Never  ?Vaping Use  ? Vaping Use: Never used  ?Substance and Sexual Activity  ? Alcohol use: Yes  ?  Comment: occasionally  ? Drug use: Never  ? Sexual activity: Yes  ?  Birth control/protection: Surgical  ?Other Topics Concern  ? Not on file  ?Social History Narrative  ? Not on file  ? ?Social Determinants of Health  ? ?Financial Resource Strain: Not on file  ?Food Insecurity: Not on file  ?Transportation Needs: Not on file  ?Physical Activity: Not on file  ?Stress: Not on file  ?Social Connections: Not on file  ? ?Family History  ?Problem Relation Age of Onset  ? Colon polyps Mother   ? Hypertension Mother   ? Hyperlipidemia Mother   ? Osteoporosis Mother   ? Colon polyps Father   ? Colon cancer Maternal Grandmother   ? Cancer Maternal Grandmother 29  ?     colon cancer  ? Esophageal cancer Neg Hx   ? Stomach cancer Neg Hx   ? Rectal cancer Neg Hx   ? ? ?   ? ?Review of Systems  ?Constitutional:  Negative for fever, malaise/fatigue and weight loss.  ?HENT:  Negative for congestion and sore throat.   ?Eyes:   ?     Negative for visual changes  ?Respiratory:  Negative for cough and shortness of breath.   ?Cardiovascular:  Negative for chest pain, palpitations and leg swelling.  ?Gastrointestinal:  Negative for blood in stool, constipation, diarrhea and heartburn.  ?Genitourinary:  Negative for dysuria, frequency and urgency.  ?Musculoskeletal:  Negative for falls, joint pain and myalgias.  ?Skin:  Negative for rash.  ?Neurological:  Negative for dizziness, sensory change and headaches.  ?Endo/Heme/Allergies:  Does not bruise/bleed easily.  ?Psychiatric/Behavioral:  Negative for depression, substance abuse and suicidal ideas. The patient is not nervous/anxious and does not have insomnia.   ? ?Objective:  ? ?Vitals:  ? 11/30/21 0819 11/30/21 0843  ?BP: 140/84  136/82  ?Pulse: 66   ?Temp: 97.7 ?F (36.5 ?C)   ?SpO2: 99%   ? ? ?Body mass index is 37.98 kg/m?. ? ?Physical Examination: ? ?Physical Exam ?Vitals reviewed.  ?Constitutional:   ?   General: She is not in acute distress. ?   Appearance: She is well-developed.  ?HENT:  ?   Right Ear: Tympanic membrane, ear canal and external ear normal.  ?   Left Ear: Tympanic membrane, ear canal and external ear normal.  ?Eyes:  ?   Extraocular Movements: Extraocular movements intact.  ?  Conjunctiva/sclera: Conjunctivae normal.  ?Cardiovascular:  ?   Rate and Rhythm: Normal rate and regular rhythm.  ?   Pulses: Normal pulses.  ?   Heart sounds: Normal heart sounds.  ?Pulmonary:  ?   Effort: Pulmonary effort is normal. No respiratory distress.  ?   Breath sounds: Normal breath sounds.  ?Chest:  ?   Chest wall: No tenderness.  ?Abdominal:  ?   General: Bowel sounds are normal.  ?   Palpations: Abdomen is soft.  ?Genitourinary: ?   Comments: declined ?Musculoskeletal:     ?   General: Normal range of motion.  ?   Cervical back: Normal range of motion and neck supple.  ?   Right lower leg: No edema.  ?   Left lower leg: No edema.  ?Skin: ?   General: Skin is warm and dry.  ?Neurological:  ?   Mental Status: She is alert and oriented to person, place, and time.  ?   Deep Tendon Reflexes: Reflexes are normal and symmetric.  ? ?ASSESSMENT and PLAN: ?This visit occurred during the SARS-CoV-2 public health emergency.  Safety protocols were in place, including screening questions prior to the visit, additional usage of staff PPE, and extensive cleaning of exam room while observing appropriate contact time as indicated for disinfecting solutions.  ? ?Kerrin was seen today for annual exam. ? ?Diagnoses and all orders for this visit: ? ?Encounter for preventative adult health care exam with abnormal findings ?-     CBC ? ?Pure hypercholesterolemia ?-     Lipid panel ? ?Breast cancer screening by mammogram ?-     MM 3D SCREEN BREAST BILATERAL;  Future ? ?Need for shingles vaccine ?-     Varicella-zoster vaccine IM ? ?Essential hypertension ? ?Pseudopolyposis of colon without complication, unspecified part of colon ([place]) ? ? ?  ? ?Problem List

## 2021-11-30 NOTE — Assessment & Plan Note (Signed)
Last colonoscopy 2023, repeat in 65yr ?No melena or hematochezia or ABD pain ?

## 2021-11-30 NOTE — Patient Instructions (Addendum)
Go to lab for blood draw ?Monitor BP at BP 2-3x/week in AM ?Send BP reading via mychart in 1week ? ?You will be contacted to schedule appt for mammogram. ? ?Preventive Care 69-55 Years Old, Female ?Preventive care refers to lifestyle choices and visits with your health care provider that can promote health and wellness. Preventive care visits are also called wellness exams. ?What can I expect for my preventive care visit? ?Counseling ?Your health care provider may ask you questions about your: ?Medical history, including: ?Past medical problems. ?Family medical history. ?Pregnancy history. ?Current health, including: ?Menstrual cycle. ?Method of birth control. ?Emotional well-being. ?Home life and relationship well-being. ?Sexual activity and sexual health. ?Lifestyle, including: ?Alcohol, nicotine or tobacco, and drug use. ?Access to firearms. ?Diet, exercise, and sleep habits. ?Work and work Statistician. ?Sunscreen use. ?Safety issues such as seatbelt and bike helmet use. ?Physical exam ?Your health care provider will check your: ?Height and weight. These may be used to calculate your BMI (body mass index). BMI is a measurement that tells if you are at a healthy weight. ?Waist circumference. This measures the distance around your waistline. This measurement also tells if you are at a healthy weight and may help predict your risk of certain diseases, such as type 2 diabetes and high blood pressure. ?Heart rate and blood pressure. ?Body temperature. ?Skin for abnormal spots. ?What immunizations do I need? ?Vaccines are usually given at various ages, according to a schedule. Your health care provider will recommend vaccines for you based on your age, medical history, and lifestyle or other factors, such as travel or where you work. ?What tests do I need? ?Screening ?Your health care provider may recommend screening tests for certain conditions. This may include: ?Lipid and cholesterol levels. ?Diabetes screening.  This is done by checking your blood sugar (glucose) after you have not eaten for a while (fasting). ?Pelvic exam and Pap test. ?Hepatitis B test. ?Hepatitis C test. ?HIV (human immunodeficiency virus) test. ?STI (sexually transmitted infection) testing, if you are at risk. ?Lung cancer screening. ?Colorectal cancer screening. ?Mammogram. Talk with your health care provider about when you should start having regular mammograms. This may depend on whether you have a family history of breast cancer. ?BRCA-related cancer screening. This may be done if you have a family history of breast, ovarian, tubal, or peritoneal cancers. ?Bone density scan. This is done to screen for osteoporosis. ?Talk with your health care provider about your test results, treatment options, and if necessary, the need for more tests. ?Follow these instructions at home: ?Eating and drinking ? ?Eat a diet that includes fresh fruits and vegetables, whole grains, lean protein, and low-fat dairy products. ?Take vitamin and mineral supplements as recommended by your health care provider. ?Do not drink alcohol if: ?Your health care provider tells you not to drink. ?You are pregnant, may be pregnant, or are planning to become pregnant. ?If you drink alcohol: ?Limit how much you have to 0-1 drink a day. ?Know how much alcohol is in your drink. In the [place] one drink equals one 12 oz bottle of beer (355 mL), one 5 oz glass of wine (148 mL), or one 1? oz glass of hard liquor (44 mL). ?Lifestyle ?Brush your teeth every morning and night with fluoride toothpaste. Floss one time each day. ?Exercise for at least 30 minutes 5 or more days each week. ?Do not use any products that contain nicotine or tobacco. These products include cigarettes, chewing tobacco, and vaping devices, such as e-cigarettes. If  you need help quitting, ask your health care provider. ?Do not use drugs. ?If you are sexually active, practice safe sex. Use a condom or other form of protection  to prevent STIs. ?If you do not wish to become pregnant, use a form of birth control. If you plan to become pregnant, see your health care provider for a prepregnancy visit. ?Take aspirin only as told by your health care provider. Make sure that you understand how much to take and what form to take. Work with your health care provider to find out whether it is safe and beneficial for you to take aspirin daily. ?Find healthy ways to manage stress, such as: ?Meditation, yoga, or listening to music. ?Journaling. ?Talking to a trusted person. ?Spending time with friends and family. ?Minimize exposure to UV radiation to reduce your risk of skin cancer. ?Safety ?Always wear your seat belt while driving or riding in a vehicle. ?Do not drive: ?If you have been drinking alcohol. Do not ride with someone who has been drinking. ?When you are tired or distracted. ?While texting. ?If you have been using any mind-altering substances or drugs. ?Wear a helmet and other protective equipment during sports activities. ?If you have firearms in your house, make sure you follow all gun safety procedures. ?Seek help if you have been physically or sexually abused. ?What's next? ?Visit your health care provider once a year for an annual wellness visit. ?Ask your health care provider how often you should have your eyes and teeth checked. ?Stay up to date on all vaccines. ?This information is not intended to replace advice given to you by your health care provider. Make sure you discuss any questions you have with your health care provider. ?Document Revised: 03/02/2021 Document Reviewed: 03/02/2021 ?Elsevier Patient Education ? [employer]. ? ?

## 2021-12-19 ENCOUNTER — Other Ambulatory Visit (HOSPITAL_COMMUNITY): Payer: Self-pay

## 2021-12-19 ENCOUNTER — Encounter: Payer: Self-pay | Admitting: Nurse Practitioner

## 2021-12-19 DIAGNOSIS — I10 Essential (primary) hypertension: Secondary | ICD-10-CM

## 2021-12-19 MED ORDER — LISINOPRIL 30 MG PO TABS
30.0000 mg | ORAL_TABLET | Freq: Every day | ORAL | 3 refills | Status: DC
  Filled 2021-12-19: qty 90, 90d supply, fill #0
  Filled 2022-03-25: qty 90, 90d supply, fill #1

## 2022-02-02 DIAGNOSIS — C44619 Basal cell carcinoma of skin of left upper limb, including shoulder: Secondary | ICD-10-CM | POA: Diagnosis not present

## 2022-03-17 ENCOUNTER — Telehealth: Payer: Self-pay

## 2022-03-22 NOTE — Telephone Encounter (Signed)
Call to help sched overdue mammogram. VM instructions left for pt to call our office.

## 2022-03-27 ENCOUNTER — Other Ambulatory Visit (HOSPITAL_COMMUNITY): Payer: Self-pay

## 2022-04-05 ENCOUNTER — Other Ambulatory Visit: Payer: Self-pay | Admitting: Internal Medicine

## 2022-04-11 ENCOUNTER — Other Ambulatory Visit (HOSPITAL_BASED_OUTPATIENT_CLINIC_OR_DEPARTMENT_OTHER): Payer: Self-pay

## 2022-04-11 ENCOUNTER — Telehealth: Payer: 59 | Admitting: Physician Assistant

## 2022-04-11 DIAGNOSIS — B9689 Other specified bacterial agents as the cause of diseases classified elsewhere: Secondary | ICD-10-CM | POA: Diagnosis not present

## 2022-04-11 DIAGNOSIS — J019 Acute sinusitis, unspecified: Secondary | ICD-10-CM | POA: Diagnosis not present

## 2022-04-11 MED ORDER — AMOXICILLIN-POT CLAVULANATE 875-125 MG PO TABS
1.0000 | ORAL_TABLET | Freq: Two times a day (BID) | ORAL | 0 refills | Status: DC
  Filled 2022-04-11: qty 14, 7d supply, fill #0

## 2022-04-11 NOTE — Patient Instructions (Signed)
Tavonna Carlisle Beers, thank you for joining Leeanne Rio, PA-C for today's virtual visit.  While this provider is not your primary care provider (PCP), if your PCP is located in our provider database this encounter information will be shared with them immediately following your visit.  Consent: (Patient) Tina Scott provided verbal consent for this virtual visit at the beginning of the encounter.  Current Medications:  Current Outpatient Medications:    Ascorbic Acid (VITAMIN C PO), Take by mouth., Disp: , Rfl:    b complex vitamins tablet, Take 1 tablet by mouth daily., Disp: , Rfl:    Cholecalciferol (VITAMIN D3 PO), Take by mouth., Disp: , Rfl:    lisinopril (ZESTRIL) 30 MG tablet, Take 1 tablet (30 mg total) by mouth daily., Disp: 90 tablet, Rfl: 3   Zinc Acetate, Oral, (ZINC ACETATE PO), Take by mouth., Disp: , Rfl:   Current Facility-Administered Medications:    0.9 %  sodium chloride infusion, 500 mL, Intravenous, Once, Pyrtle, Lajuan Lines, MD   Medications ordered in this encounter:  No orders of the defined types were placed in this encounter.    *If you need refills on other medications prior to your next appointment, please contact your pharmacy*  Follow-Up: Call back or seek an in-person evaluation if the symptoms worsen or if the condition fails to improve as anticipated.  Other Instructions Please take antibiotic as directed.  Increase fluid intake.  Use Saline nasal spray.  Take a daily multivitamin. Ok to continue Tylenol. You can use Mucinex-DM OTC or can start Coricidin HBP over the counter.  Place a humidifier in the bedroom.  Please call or return clinic if symptoms are not improving.  Sinusitis Sinusitis is redness, soreness, and swelling (inflammation) of the paranasal sinuses. Paranasal sinuses are air pockets within the bones of your face (beneath the eyes, the middle of the forehead, or above the eyes). In healthy paranasal sinuses, mucus is able to drain  out, and air is able to circulate through them by way of your nose. However, when your paranasal sinuses are inflamed, mucus and air can become trapped. This can allow bacteria and other germs to grow and cause infection. Sinusitis can develop quickly and last only a short time (acute) or continue over a long period (chronic). Sinusitis that lasts for more than 12 weeks is considered chronic.  CAUSES  Causes of sinusitis include: Allergies. Structural abnormalities, such as displacement of the cartilage that separates your nostrils (deviated septum), which can decrease the air flow through your nose and sinuses and affect sinus drainage. Functional abnormalities, such as when the small hairs (cilia) that line your sinuses and help remove mucus do not work properly or are not present. SYMPTOMS  Symptoms of acute and chronic sinusitis are the same. The primary symptoms are pain and pressure around the affected sinuses. Other symptoms include: Upper toothache. Earache. Headache. Bad breath. Decreased sense of smell and taste. A cough, which worsens when you are lying flat. Fatigue. Fever. Thick drainage from your nose, which often is green and may contain pus (purulent). Swelling and warmth over the affected sinuses. DIAGNOSIS  Your caregiver will perform a physical exam. During the exam, your caregiver may: Look in your nose for signs of abnormal growths in your nostrils (nasal polyps). Tap over the affected sinus to check for signs of infection. View the inside of your sinuses (endoscopy) with a special imaging device with a light attached (endoscope), which is inserted into your sinuses. If  your caregiver suspects that you have chronic sinusitis, one or more of the following tests may be recommended: Allergy tests. Nasal culture A sample of mucus is taken from your nose and sent to a lab and screened for bacteria. Nasal cytology A sample of mucus is taken from your nose and examined by  your caregiver to determine if your sinusitis is related to an allergy. TREATMENT  Most cases of acute sinusitis are related to a viral infection and will resolve on their own within 10 days. Sometimes medicines are prescribed to help relieve symptoms (pain medicine, decongestants, nasal steroid sprays, or saline sprays).  However, for sinusitis related to a bacterial infection, your caregiver will prescribe antibiotic medicines. These are medicines that will help kill the bacteria causing the infection.  Rarely, sinusitis is caused by a fungal infection. In theses cases, your caregiver will prescribe antifungal medicine. For some cases of chronic sinusitis, surgery is needed. Generally, these are cases in which sinusitis recurs more than 3 times per year, despite other treatments. HOME CARE INSTRUCTIONS  Drink plenty of water. Water helps thin the mucus so your sinuses can drain more easily. Use a humidifier. Inhale steam 3 to 4 times a day (for example, sit in the bathroom with the shower running). Apply a warm, moist washcloth to your face 3 to 4 times a day, or as directed by your caregiver. Use saline nasal sprays to help moisten and clean your sinuses. Take over-the-counter or prescription medicines for pain, discomfort, or fever only as directed by your caregiver. SEEK IMMEDIATE MEDICAL CARE IF: You have increasing pain or severe headaches. You have nausea, vomiting, or drowsiness. You have swelling around your face. You have vision problems. You have a stiff neck. You have difficulty breathing. MAKE SURE YOU:  Understand these instructions. Will watch your condition. Will get help right away if you are not doing well or get worse. Document Released: 09/04/2005 Document Revised: 11/27/2011 Document Reviewed: 09/19/2011 [facility] Patient Information 2014 [place], [place].    If you have been instructed to have an in-person evaluation today at a local Urgent Care facility, please use  the link below. It will take you to a list of all of our available [facility], including address, phone number and hours of operation. Please do not delay care.  [facility]  If you or a family member do not have a primary care provider, use the link below to schedule a visit and establish care. When you choose a [facility] primary care physician or advanced practice provider, you gain a long-term partner in health. Find a Primary Care Provider  Learn more about [facility]'s in-office and virtual care options: [facility]

## 2022-04-11 NOTE — Progress Notes (Signed)
Virtual Visit Consent   Tina Scott, you are scheduled for a virtual visit with a [facility] provider today. Just as with appointments in the office, your consent must be obtained to participate. Your consent will be active for this visit and any virtual visit you may have with one of our providers in the next 365 days. If you have a MyChart account, a copy of this consent can be sent to you electronically.  As this is a virtual visit, video technology does not allow for your provider to perform a traditional examination. This may limit your provider's ability to fully assess your condition. If your provider identifies any concerns that need to be evaluated in person or the need to arrange testing (such as labs, EKG, etc.), we will make arrangements to do so. Although advances in technology are sophisticated, we cannot ensure that it will always work on either your end or our end. If the connection with a video visit is poor, the visit may have to be switched to a telephone visit. With either a video or telephone visit, we are not always able to ensure that we have a secure connection.  By engaging in this virtual visit, you consent to the provision of healthcare and authorize for your insurance to be billed (if applicable) for the services provided during this visit. Depending on your insurance coverage, you may receive a charge related to this service.  I need to obtain your verbal consent now. Are you willing to proceed with your visit today? Tina Scott has provided verbal consent on 04/11/2022 for a virtual visit (video or telephone). Leeanne Rio, [place]  Date: 04/11/2022 9:03 AM  Virtual Visit via Video Note   I, Leeanne Rio, connected with  Tina Scott  ([id number], 1965-11-01) on 04/11/22 at  9:00 AM EDT by a video-enabled telemedicine application and verified that I am speaking with the correct person using two identifiers.  Location: Patient: Virtual Visit  Location Patient: Home Provider: Virtual Visit Location Provider: Home Office   I discussed the limitations of evaluation and management by telemedicine and the availability of in person appointments. The patient expressed understanding and agreed to proceed.    History of Present Illness: Tina Scott is a 56 y.o. who identifies as a female who was assigned female at birth, and is being seen today for URI symptoms over the past week or two. Notes initially with nasal congestion, hoarseness, scratchy throat that would wax and wane. Notes symptoms initially improving/resolving but over the past few days now with recurrence of sinus congestion with PND and sinus pain. Denies fever.  Has taken COVID test which was negative. Is concerned about a sinusitis.   Has taken Tylenol for headache.   HPI: HPI  Problems:  Patient Active Problem List   Diagnosis Date Noted   Pseudopolyposis of colon without complication, unspecified part of colon ([place]) 11/30/2021   Vitamin D insufficiency 06/16/2021   Hyperparathyroidism ([place]) 03/09/2021   Hypercalcemia 11/25/2020   Hx of basal cell carcinoma excision 11/24/2020   Hx of colonic polyp 05/25/2020   Pure hypercholesterolemia 05/25/2020   Carpal tunnel syndrome on right 01/02/2017   Essential hypertension 08/18/2016    Allergies:  Allergies  Allergen Reactions   Sulfa Antibiotics Rash    Rash with sun exposure.    Medications:  Current Outpatient Medications:    amoxicillin-clavulanate (AUGMENTIN) 875-125 MG tablet, Take 1 tablet by mouth 2 (two) times daily., Disp: 14 tablet, Rfl:  0   Ascorbic Acid (VITAMIN C PO), Take by mouth., Disp: , Rfl:    b complex vitamins tablet, Take 1 tablet by mouth daily., Disp: , Rfl:    Cholecalciferol (VITAMIN D3 PO), Take by mouth., Disp: , Rfl:    lisinopril (ZESTRIL) 30 MG tablet, Take 1 tablet (30 mg total) by mouth daily., Disp: 90 tablet, Rfl: 3   Zinc Acetate, Oral, (ZINC ACETATE PO), Take by mouth.,  Disp: , Rfl:   Observations/Objective: Patient is well-developed, well-nourished in no acute distress.  Resting comfortably at home.  Head is normocephalic, atraumatic.  No labored breathing. Speech is clear and coherent with logical content.  Patient is alert and oriented at baseline.   Assessment and Plan: 1. Acute bacterial sinusitis - amoxicillin-clavulanate (AUGMENTIN) 875-125 MG tablet; Take 1 tablet by mouth 2 (two) times daily.  Dispense: 14 tablet; Refill: 0  Rx Augmentin.  Increase fluids.  Rest.  Saline nasal spray.  Probiotic.  Mucinex as directed.  Humidifier in bedroom. Other OTC medications reviewed.  Call or return to clinic if symptoms are not improving.   Follow Up Instructions: I discussed the assessment and treatment plan with the patient. The patient was provided an opportunity to ask questions and all were answered. The patient agreed with the plan and demonstrated an understanding of the instructions.  A copy of instructions were sent to the patient via MyChart unless otherwise noted below.   The patient was advised to call back or seek an in-person evaluation if the symptoms worsen or if the condition fails to improve as anticipated.  Time:  I spent 10 minutes with the patient via telehealth technology discussing the above problems/concerns.    Leeanne Rio, PA-C

## 2022-05-03 ENCOUNTER — Encounter: Payer: Self-pay | Admitting: Internal Medicine

## 2022-05-03 ENCOUNTER — Ambulatory Visit (INDEPENDENT_AMBULATORY_CARE_PROVIDER_SITE_OTHER): Payer: 59 | Admitting: Internal Medicine

## 2022-05-03 VITALS — BP 126/80 | HR 60 | Ht 64.25 in | Wt 220.0 lb

## 2022-05-03 DIAGNOSIS — E213 Hyperparathyroidism, unspecified: Secondary | ICD-10-CM | POA: Diagnosis not present

## 2022-05-03 DIAGNOSIS — E559 Vitamin D deficiency, unspecified: Secondary | ICD-10-CM | POA: Diagnosis not present

## 2022-05-03 LAB — BASIC METABOLIC PANEL
BUN: 10 mg/dL (ref 6–23)
CO2: 30 mEq/L (ref 19–32)
Calcium: 10.9 mg/dL — ABNORMAL HIGH (ref 8.4–10.5)
Chloride: 103 mEq/L (ref 96–112)
Creatinine, Ser: 0.73 mg/dL (ref 0.40–1.20)
GFR: 92.85 mL/min (ref >60.00)
Glucose, Bld: 88 mg/dL (ref 70–99)
Potassium: 4 mEq/L (ref 3.5–5.1)
Sodium: 139 mEq/L (ref 135–145)

## 2022-05-03 LAB — VITAMIN D 25 HYDROXY (VIT D DEFICIENCY, FRACTURES): VITD: 40.44 ng/mL (ref 30.00–100.00)

## 2022-05-03 LAB — ALBUMIN: Albumin: 4.3 g/dL (ref 3.5–5.2)
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
## 2022-05-03 NOTE — Patient Instructions (Signed)
-   Please stay hydrated - AVOID CALCIUM SUPPLEMENTS, AVOID LOW CALCIUM DIET - Maintain normal dietary calcium intake (2-3 servings of dairy a day) - Continue Vitamin D 4000 iu daily for now

## 2022-05-04 LAB — PARATHYROID HORMONE, INTACT (NO CA): PTH: 64 pg/mL (ref 16–77)

## 2022-05-12 ENCOUNTER — Ambulatory Visit
Admission: RE | Admit: 2022-05-12 | Discharge: 2022-05-12 | Disposition: A | Discharge status: Home / Self Care | Payer: 59 | Source: Ambulatory Visit | Attending: Emergency Medicine | Admitting: Emergency Medicine

## 2022-05-12 ENCOUNTER — Other Ambulatory Visit (HOSPITAL_BASED_OUTPATIENT_CLINIC_OR_DEPARTMENT_OTHER): Payer: Self-pay

## 2022-05-12 ENCOUNTER — Ambulatory Visit: Payer: 59

## 2022-05-12 ENCOUNTER — Ambulatory Visit (INDEPENDENT_AMBULATORY_CARE_PROVIDER_SITE_OTHER): Payer: 59

## 2022-05-12 VITALS — BP 143/81 | HR 58 | Temp 98.6°F | Resp 16

## 2022-05-12 DIAGNOSIS — M25561 Pain in right knee: Secondary | ICD-10-CM

## 2022-05-12 DIAGNOSIS — M13161 Monoarthritis, not elsewhere classified, right knee: Secondary | ICD-10-CM

## 2022-05-12 MED ORDER — DICLOFENAC SODIUM 1 % EX GEL
4.0000 g | Freq: Four times a day (QID) | CUTANEOUS | 2 refills | Status: AC
  Filled 2022-05-12: qty 100, 30d supply, fill #0

## 2022-05-12 MED ORDER — LIDOCAINE 5 % EX PTCH
1.0000 | MEDICATED_PATCH | CUTANEOUS | 0 refills | Status: DC
  Filled 2022-05-12: qty 30, 30d supply, fill #0

## 2022-05-12 MED ORDER — TRIAMCINOLONE ACETONIDE 40 MG/ML IJ SUSP
60.0000 mg | Freq: Once | INTRAMUSCULAR | Status: AC
  Administered 2022-05-12: 60 mg via INTRAMUSCULAR

## 2022-05-12 MED ORDER — METHYLPREDNISOLONE 8 MG PO TABS
ORAL_TABLET | ORAL | 0 refills | Status: AC
  Filled 2022-05-12: qty 13, 8d supply, fill #0
  Filled 2022-05-12: qty 17, 4d supply, fill #0

## 2022-05-12 NOTE — ED Triage Notes (Signed)
Pt states she has had intermittent right knee pain since July and this week the pain began getting worse. The patient states her daughter noticed that the right knee was bigger than the left. Pt denies SOB, and chest pain.

## 2022-05-12 NOTE — ED Provider Notes (Signed)
[facility]    CSN: [id number] Arrival date & time: 05/12/22  0903    HISTORY   Chief Complaint  Patient presents with   Knee Pain    Could not bare weight on it yesterday afternoon or last night  Has been painful for last 3 days rest, heat, nor ice relieved and intermittent pain last 3 weeks. - Entered by patient   HPI Tina Scott is a pleasant, 56 y.o. female who presents to urgent care today. Patient complains of right knee pain that is been intermittent and worsening for the past 2 months.  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  Patient states that she continues to do so but is having a significant amount of pain secondary to what she believes is likely arthritis in her knee.  Patient states she already has an appointment set up with sports medicine next week but is here today for help with pain.  Patient states that her daughter is concerned because her right knee seems to be significantly bigger than her left knee.  Patient states she is not entirely sure that she does not have a blood clot.  The history is provided by the patient.   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   Left Shoulder - anterior   Patient Active Problem List   Diagnosis Date Noted   Pseudopolyposis of colon without complication, unspecified part of colon ([place]) 11/30/2021   Vitamin D insufficiency 06/16/2021   Hyperparathyroidism ([place]) 03/09/2021   Hypercalcemia 11/25/2020   Hx of basal cell carcinoma excision 11/24/2020   Hx of colonic polyp 05/25/2020   Pure hypercholesterolemia 05/25/2020   Carpal tunnel syndrome on right 01/02/2017   Essential hypertension 08/18/2016   Past Surgical History:  Procedure Laterality Date   CESAREAN SECTION     x2   COLONOSCOPY     HERNIA REPAIR     Hernia Revision also.    POLYPECTOMY     TUBAL  LIGATION     OB History   No obstetric history on file.    Home Medications    Prior to Admission medications   Medication Sig Start Date End Date Taking? Authorizing Provider  Ascorbic Acid (VITAMIN C PO) Take by mouth.    [provider]  b complex vitamins tablet Take 1 tablet by mouth daily.    [provider]  Cholecalciferol (VITAMIN D3 PO) Take by mouth.    [provider]  lisinopril (ZESTRIL) 30 MG tablet Take 1 tablet (30 mg total) by mouth daily. 12/19/21   Nche, Charlene Brooke, NP  Zinc Acetate, Oral, (ZINC ACETATE PO) Take by mouth.    [provider]    Family History Family History  Problem Relation Age of Onset   Colon polyps Mother    Hypertension Mother    Hyperlipidemia Mother    Osteoporosis Mother    Colon polyps Father    Colon cancer Maternal Grandmother    Cancer Maternal Grandmother 73       colon cancer   Esophageal cancer Neg Hx    Stomach cancer Neg Hx    Rectal cancer Neg Hx    Social History Social History   Tobacco Use   Smoking status: Former   Smokeless tobacco: Never  Vaping Use   Vaping Use: Never used  Substance Use Topics   Alcohol use: Yes    Comment: occasionally   Drug use: Never   Allergies   Sulfa antibiotics  Review of Systems Review of Systems Pertinent findings revealed after performing a 14 point review of systems has been noted in the history of present illness.  Physical Exam Triage Vital Signs ED Triage Vitals  Enc Vitals Group     BP 07/15/21 0827 (!) 147/82     Pulse Rate 07/15/21 0827 72     Resp 07/15/21 0827 18     Temp 07/15/21 0827 98.3 F (36.8 C)     Temp Source 07/15/21 0827 Oral     SpO2 07/15/21 0827 98 %     Weight --      Height --      Head Circumference --      Peak Flow --      Pain Score 07/15/21 0826 5     Pain Loc --      Pain Edu? --      Excl. in [place]? --    Updated Vital Signs BP (!) 143/81 (BP Location: Left Arm)   Pulse (!) 58   Temp 98.6  F (37 C) (Oral)   Resp 16   SpO2 98%   Physical Exam Vitals and nursing note reviewed.  Constitutional:      General: She is awake. She is not in acute distress.    Appearance: Normal appearance. She is well-developed and well-groomed. She is obese.     Comments: Patient ambulated independently into the exam room.  HENT:     Head: Normocephalic and atraumatic.  Eyes:     Pupils: Pupils are equal, round, and reactive to light.  Cardiovascular:     Rate and Rhythm: Normal rate and regular rhythm.  Pulmonary:     Effort: Pulmonary effort is normal.     Breath sounds: Normal breath sounds.  Musculoskeletal:        General: Normal range of motion.     Cervical back: Normal range of motion and neck supple.     Right hip: Normal.     Left hip: Normal.     Right upper leg: Normal.     Left upper leg: Normal.     Right knee: Swelling, deformity, bony tenderness and crepitus present. No effusion, erythema, ecchymosis or lacerations. Normal range of motion. No tenderness. No LCL laxity, MCL laxity, ACL laxity or PCL laxity. Abnormal alignment. Normal meniscus and normal patellar mobility. Normal pulse.     Instability Tests: Anterior drawer test negative. Posterior drawer test negative. Anterior Lachman test negative. Medial McMurray test negative and lateral McMurray test negative.     Left knee: Normal.     Right lower leg: Swelling present. No deformity, lacerations, tenderness or bony tenderness. 2+ Edema present.     Left lower leg: Normal.  Skin:    General: Skin is warm and dry.  Neurological:     General: No focal deficit present.     Mental Status: She is alert, oriented to person, place, and time and easily aroused. Mental status is at baseline.     Sensory: Sensation is intact.     Motor: Motor function is intact.     Coordination: Coordination is intact.     Deep Tendon Reflexes: Reflexes are normal and symmetric.  Psychiatric:        Mood and Affect: Mood normal.         Behavior: Behavior normal. Behavior  is cooperative.        Thought Content: Thought content normal.        Judgment: Judgment normal.     [facility] / Diagnostics / Procedures:     Radiology DG Knee AP/LAT W/Sunrise Right  Result Date: 05/12/2022 CLINICAL DATA:  Right knee pain and swelling.  No known injury EXAM: RIGHT KNEE 3 VIEWS COMPARISON:  None Available. FINDINGS: No evidence of fracture, dislocation, or significant joint effusion. No evidence of arthropathy or other focal bone abnormality. Soft tissues are unremarkable. IMPRESSION: Negative. Electronically Signed   By: Davina Poke D.O.   On: 05/12/2022 10:03    Procedures Procedures (including critical care time) EKG  Pending results:  Labs Reviewed  URIC ACID  COMPREHENSIVE METABOLIC PANEL  CBC WITH DIFFERENTIAL/PLATELET    Medications Ordered in UC: Medications  triamcinolone acetonide (KENALOG-40) injection 60 mg (has no administration in time range)    UC Diagnoses / Final Clinical Impressions(s)   I have reviewed the triage vital signs and the nursing notes.  Pertinent labs & imaging results that were available during my care of the patient were reviewed by me and considered in my medical decision making (see chart for details).    Final diagnoses:  Inflammation of joint of right knee   Patient was provided with an injection during their visit today for acute pain relief.  Patient was advised to:  Begin tapering dose of methylprednisolone Begin acetaminophen 1000 mg 3 times daily on a scheduled basis not to exceed 3000 mg/day Apply ice pack to affected area 4 times daily for 20 minutes each time Apply topical Voltaren gel 4 times daily as needed Apply lidocaine patch as needed Avoid stretching or strengthening exercises until pain is completely resolved Patient education handout provided for self-care at home. Consider physical therapy, chiropractic care, orthopedic follow-up Return precautions  advised  ED Prescriptions     Medication Sig Dispense Auth. Provider   methylPREDNISolone (MEDROL) 8 MG tablet Take 4 tablets (32 mg total) by mouth daily for 3 days, THEN 3 tablets (24 mg total) daily for 3 days, THEN 2 tablets (16 mg total) daily for 3 days, THEN 1 tablet (8 mg total) daily for 3 days. 30 tablet Lynden Oxford Scales, PA-C   diclofenac Sodium (VOLTAREN) 1 % GEL Apply 4 g topically 4 (four) times daily. Apply to affected areas 4 times daily as needed for pain. 100 g Lynden Oxford Scales, PA-C   lidocaine (LIDODERM) 5 % Place 1 patch onto the skin daily. Remove & Discard patch within 12 hours or as directed by MD 30 patch Lynden Oxford Scales, PA-C      PDMP not reviewed this encounter.  Discharge Instructions:   Discharge Instructions      I enclosed some information about possible causes of the inflammation and pain in your knee.  These include gout and iliotibial bursitis.  A Baker's cyst is also another possibility.  Regardless of which of these handouts describes your situation best, we do know that steroids will treat them all.  I provided you with a prescription for methylprednisolone and there would like for you to take as prescribed.    I also provided you with topical treatment options, please use the one that states you best or if you feel that putting them together is even better, it is safe to use both.  Diclofenac gel can be applied 4 times daily as directed.  Lidoderm patches are applied once daily.  My thought is  what ever gets you through your day is what you need to do.  I also strongly encourage you to consider taking Tylenol 1,000 mg 3 times daily.  While this will not completely resolve your pain, as an analgesic, it will cause your pain to be a lot less bothersome.  3000 mg in a 24-hour period is a safe dose and will not harm your liver in any way.  Please be sure that you check labels of over-the-counter cough and cold preparations as they often  contain acetaminophen so that you do not exceed 3000 mg/day.  As we discussed, I provided you with a note to be out of work until Monday.  Please use this note at your discretion.  Thank you for all the work that you do to care for all of our patients.  You are right that providing the best care to our patients is a team effort and I am confident that if I worked with you, I would be happy to be on your team.  Thank you for visiting urgent care today, it was a pleasure to meet you and I wish you all the best.      Disposition Upon Discharge:  Condition: stable for discharge home Home: take medications as prescribed; routine discharge instructions as discussed; follow up as advised.  Patient presented with an acute illness with associated systemic symptoms and significant discomfort requiring urgent management. In my opinion, this is a condition that a prudent lay person (someone who possesses an average knowledge of health and medicine) may potentially expect to result in complications if not addressed urgently such as respiratory distress, impairment of bodily function or dysfunction of bodily organs.   Routine symptom specific, illness specific and/or disease specific instructions were discussed with the patient and/or caregiver at length.   As such, the patient has been evaluated and assessed, work-up was performed and treatment was provided in alignment with urgent care protocols and evidence based medicine.  Patient/parent/caregiver has been advised that the patient may require follow up for further testing and treatment if the symptoms continue in spite of treatment, as clinically indicated and appropriate.  Patient/parent/caregiver has been advised to report to orthopedic urgent care clinic or return to the [facility] or PCP in 3-5 days if no better; follow-up with orthopedics, PCP or the Emergency Department if new signs and symptoms develop or if the current signs or symptoms continue to change  or worsen for further workup, evaluation and treatment as clinically indicated and appropriate  The patient will follow up with their current PCP if and as advised. If the patient does not currently have a PCP we will have assisted them in obtaining one.   The patient may need specialty follow up if the symptoms continue, in spite of conservative treatment and management, for further workup, evaluation, consultation and treatment as clinically indicated and appropriate.  Patient/parent/caregiver verbalized understanding and agreement of plan as discussed.  All questions were addressed during visit.  Please see discharge instructions below for further details of plan.  This office note has been dictated using Museum/gallery curator.  Unfortunately, this method of dictation can sometimes lead to typographical or grammatical errors.  I apologize for your inconvenience in advance if this occurs.  Please do not hesitate to reach out to me if clarification is needed.      Lynden Oxford Scales, PA-C 05/12/22 1030

## 2022-05-12 NOTE — Discharge Instructions (Addendum)
I enclosed some information about possible causes of the inflammation and pain in your knee.  These include gout and iliotibial bursitis.  A Baker's cyst is also another possibility.  Regardless of which of these handouts describes your situation best, we do know that steroids will treat them all.  I provided you with a prescription for methylprednisolone and there would like for you to take as prescribed.    I also provided you with topical treatment options, please use the one that states you best or if you feel that putting them together is even better, it is safe to use both.  Diclofenac gel can be applied 4 times daily as directed.  Lidoderm patches are applied once daily.  My thought is what ever gets you through your day is what you need to do.  I also strongly encourage you to consider taking Tylenol 1,000 mg 3 times daily.  While this will not completely resolve your pain, as an analgesic, it will cause your pain to be a lot less bothersome.  3000 mg in a 24-hour period is a safe dose and will not harm your liver in any way.  Please be sure that you check labels of over-the-counter cough and cold preparations as they often contain acetaminophen so that you do not exceed 3000 mg/day.  As we discussed, I provided you with a note to be out of work until Monday.  Please use this note at your discretion.  Thank you for all the work that you do to care for all of our patients.  You are right that providing the best care to our patients is a team effort and I am confident that if I worked with you, I would be happy to be on your team.  Thank you for visiting urgent care today, it was a pleasure to meet you and I wish you all the best.

## 2022-05-13 LAB — COMPREHENSIVE METABOLIC PANEL
ALT: 12 IU/L (ref 0–32)
AST: 12 IU/L (ref 0–40)
Albumin/Globulin Ratio: 1.7 (ref 1.2–2.2)
Albumin: 4.8 g/dL (ref 3.8–4.9)
Alkaline Phosphatase: 105 IU/L (ref 44–121)
BUN/Creatinine Ratio: 14 (ref 9–23)
BUN: 10 mg/dL (ref 6–24)
Bilirubin Total: 0.4 mg/dL (ref 0.0–1.2)
CO2: 24 mmol/L (ref 20–29)
Calcium: 11 mg/dL — ABNORMAL HIGH (ref 8.7–10.2)
Chloride: 103 mmol/L (ref 96–106)
Creatinine, Ser: 0.74 mg/dL (ref 0.57–1.00)
Globulin, Total: 2.9 g/dL (ref 1.5–4.5)
Glucose: 86 mg/dL (ref 70–99)
Potassium: 4.4 mmol/L (ref 3.5–5.2)
Sodium: 140 mmol/L (ref 134–144)
Total Protein: 7.7 g/dL (ref 6.0–8.5)
eGFR: 95 mL/min/{1.73_m2} (ref >59)

## 2022-05-13 LAB — CBC WITH DIFFERENTIAL/PLATELET
Basophils Absolute: 0.1 10*3/uL (ref 0.0–0.2)
Basos: 1 %
EOS (ABSOLUTE): 0.1 10*3/uL (ref 0.0–0.4)
Eos: 2 %
Hematocrit: 43.4 % (ref 34.0–46.6)
Hemoglobin: 14.2 g/dL (ref 11.1–15.9)
Immature Grans (Abs): 0 10*3/uL (ref 0.0–0.1)
Immature Granulocytes: 0 %
Lymphocytes Absolute: 1.6 10*3/uL (ref 0.7–3.1)
Lymphs: 23 %
MCH: 28.4 pg (ref 26.6–33.0)
MCHC: 32.7 g/dL (ref 31.5–35.7)
MCV: 87 fL (ref 79–97)
Monocytes Absolute: 0.4 10*3/uL (ref 0.1–0.9)
Monocytes: 6 %
Neutrophils Absolute: 4.7 10*3/uL (ref 1.4–7.0)
Neutrophils: 68 %
Platelets: 338 10*3/uL (ref 150–450)
RBC: 5 x10E6/uL (ref 3.77–5.28)
RDW: 13 % (ref 11.7–15.4)
WBC: 6.8 10*3/uL (ref 3.4–10.8)

## 2022-05-13 LAB — URIC ACID: Uric Acid: 6.5 mg/dL (ref 3.0–7.2)

## 2022-05-15 ENCOUNTER — Ambulatory Visit: Payer: Self-pay

## 2022-05-15 ENCOUNTER — Encounter: Payer: Self-pay | Admitting: Family Medicine

## 2022-05-15 ENCOUNTER — Ambulatory Visit (INDEPENDENT_AMBULATORY_CARE_PROVIDER_SITE_OTHER): Payer: 59 | Admitting: Family Medicine

## 2022-05-15 VITALS — BP 154/100 | Ht 64.25 in | Wt 220.0 lb

## 2022-05-15 DIAGNOSIS — M23203 Derangement of unspecified medial meniscus due to old tear or injury, right knee: Secondary | ICD-10-CM

## 2022-05-15 DIAGNOSIS — M25561 Pain in right knee: Secondary | ICD-10-CM

## 2022-05-15 DIAGNOSIS — M179 Osteoarthritis of knee, unspecified: Secondary | ICD-10-CM | POA: Insufficient documentation

## 2022-05-15 NOTE — Progress Notes (Signed)
  Tina Scott - 56 y.o. female MRN [id number]  Date of birth: October 26, 1965  SUBJECTIVE:  Including CC & ROS.  No chief complaint on file.   Tina Scott is a 56 y.o. female that is presenting with right knee pain.  The pain is medial in nature.  Still having trouble putting weight on the knee.  No injury inciting event.  She does walk several steps in the course of that work.  Does get improvement with the steroid.  Review of the urgent care note from 8/25 shows she was provided lidocaine and Medrol dose pack Independent review of the right knee x-ray from 8/25 shows no acute changes.  Review of Systems See HPI   HISTORY: Past Medical, Surgical, Social, and Family History Reviewed & Updated per EMR.   Pertinent Historical Findings include:  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   Left Shoulder - anterior    Past Surgical History:  Procedure Laterality Date   CESAREAN SECTION     x2   COLONOSCOPY     HERNIA REPAIR     Hernia Revision also.    POLYPECTOMY     TUBAL LIGATION       PHYSICAL EXAM:  VS: BP (!) 154/100 (BP Location: Left Arm, Patient Position: Sitting)   Ht 5' 4.25" (1.632 m)   Wt 220 lb (99.8 kg)   BMI 37.47 kg/m  Physical Exam Gen: NAD, alert, cooperative with exam, well-appearing MSK:  Neurovascularly intact   Limited ultrasound: Right knee:  Mild effusion suprapatellar pouch. Normal-appearing quadricep and patellar tendon. Normal-appearing lateral joint space. Outpouching and degenerative changes of the medial meniscus. No significant Baker's cyst  Summary: Degenerative changes of the medial meniscus  Ultrasound and interpretation by Clearance Coots, MD     ASSESSMENT & PLAN:   Degenerative tear of medial meniscus of right knee Acutely occurring.  Exam is reassuring but does have a mild effusion ongoing. -Counseled on home exercise therapy  and supportive care. -Green sport insoles. -Hinged knee brace. -Provided work note. -Could consider injection or physical therapy.

## 2022-05-15 NOTE — Patient Instructions (Signed)
Nice to meet you  Please use ice as needed  Please try the insoles  Please try the exercises  Please send me a message in MyChart with any questions or updates.  Please see me back in 4 weeks or sooner if needed.   --Dr. Raeford Razor

## 2022-05-15 NOTE — Assessment & Plan Note (Signed)
Acutely occurring.  Exam is reassuring but does have a mild effusion ongoing. -Counseled on home exercise therapy and supportive care. -Green sport insoles. -Hinged knee brace. -Provided work note. -Could consider injection or physical therapy.

## 2022-05-24 ENCOUNTER — Ambulatory Visit
Admission: RE | Admit: 2022-05-24 | Discharge: 2022-05-24 | Disposition: A | Discharge status: Home / Self Care | Payer: 59 | Source: Ambulatory Visit | Attending: Nurse Practitioner | Admitting: Nurse Practitioner

## 2022-05-24 DIAGNOSIS — M25671 Stiffness of right ankle, not elsewhere classified: Secondary | ICD-10-CM | POA: Diagnosis not present

## 2022-05-24 DIAGNOSIS — Z1231 Encounter for screening mammogram for malignant neoplasm of breast: Secondary | ICD-10-CM

## 2022-05-24 DIAGNOSIS — M7918 Myalgia, other site: Secondary | ICD-10-CM | POA: Diagnosis not present

## 2022-05-24 DIAGNOSIS — M25561 Pain in right knee: Secondary | ICD-10-CM | POA: Diagnosis not present

## 2022-05-24 DIAGNOSIS — M1711 Unilateral primary osteoarthritis, right knee: Secondary | ICD-10-CM | POA: Diagnosis not present

## 2022-05-24 DIAGNOSIS — M25851 Other specified joint disorders, right hip: Secondary | ICD-10-CM | POA: Diagnosis not present

## 2022-05-24 DIAGNOSIS — S83241A Other tear of medial meniscus, current injury, right knee, initial encounter: Secondary | ICD-10-CM | POA: Diagnosis not present

## 2022-05-24 DIAGNOSIS — M9906 Segmental and somatic dysfunction of lower extremity: Secondary | ICD-10-CM | POA: Diagnosis not present

## 2022-05-24 DIAGNOSIS — M25551 Pain in right hip: Secondary | ICD-10-CM | POA: Diagnosis not present

## 2022-05-30 DIAGNOSIS — M25561 Pain in right knee: Secondary | ICD-10-CM | POA: Diagnosis not present

## 2022-05-30 DIAGNOSIS — M7918 Myalgia, other site: Secondary | ICD-10-CM | POA: Diagnosis not present

## 2022-05-30 DIAGNOSIS — S83241A Other tear of medial meniscus, current injury, right knee, initial encounter: Secondary | ICD-10-CM | POA: Diagnosis not present

## 2022-05-30 DIAGNOSIS — M25551 Pain in right hip: Secondary | ICD-10-CM | POA: Diagnosis not present

## 2022-05-30 DIAGNOSIS — M9906 Segmental and somatic dysfunction of lower extremity: Secondary | ICD-10-CM | POA: Diagnosis not present

## 2022-05-30 DIAGNOSIS — M1711 Unilateral primary osteoarthritis, right knee: Secondary | ICD-10-CM | POA: Diagnosis not present

## 2022-05-30 DIAGNOSIS — M25851 Other specified joint disorders, right hip: Secondary | ICD-10-CM | POA: Diagnosis not present

## 2022-05-30 DIAGNOSIS — M25671 Stiffness of right ankle, not elsewhere classified: Secondary | ICD-10-CM | POA: Diagnosis not present

## 2022-06-06 DIAGNOSIS — M7918 Myalgia, other site: Secondary | ICD-10-CM | POA: Diagnosis not present

## 2022-06-06 DIAGNOSIS — M9906 Segmental and somatic dysfunction of lower extremity: Secondary | ICD-10-CM | POA: Diagnosis not present

## 2022-06-06 DIAGNOSIS — S83241A Other tear of medial meniscus, current injury, right knee, initial encounter: Secondary | ICD-10-CM | POA: Diagnosis not present

## 2022-06-06 DIAGNOSIS — M25561 Pain in right knee: Secondary | ICD-10-CM | POA: Diagnosis not present

## 2022-06-06 DIAGNOSIS — M1711 Unilateral primary osteoarthritis, right knee: Secondary | ICD-10-CM | POA: Diagnosis not present

## 2022-06-06 DIAGNOSIS — M25551 Pain in right hip: Secondary | ICD-10-CM | POA: Diagnosis not present

## 2022-06-06 DIAGNOSIS — M25671 Stiffness of right ankle, not elsewhere classified: Secondary | ICD-10-CM | POA: Diagnosis not present

## 2022-06-06 DIAGNOSIS — M25851 Other specified joint disorders, right hip: Secondary | ICD-10-CM | POA: Diagnosis not present

## 2022-06-07 ENCOUNTER — Ambulatory Visit: Payer: 59 | Admitting: Nurse Practitioner

## 2022-06-07 ENCOUNTER — Encounter: Payer: Self-pay | Admitting: Nurse Practitioner

## 2022-06-07 VITALS — BP 140/70 | HR 63 | Temp 97.2°F | Ht 64.0 in | Wt 222.8 lb

## 2022-06-07 DIAGNOSIS — I10 Essential (primary) hypertension: Secondary | ICD-10-CM

## 2022-06-07 NOTE — Progress Notes (Signed)
                Established Patient Visit  Patient: Tina Scott   DOB: 09/02/1966   56 y.o. Female  MRN: [id number] Visit Date: 06/07/2022  Subjective:    Chief Complaint  Patient presents with   Office Visit    HTN F/u Pt fasting Doesn't check BP  No concerns    HPI Essential hypertension BP not at goal BP Readings from Last 3 Encounters:  06/07/22 (!) 140/70  05/15/22 (!) 154/100  05/12/22 (!) 143/81   Increase lisinopril to '40mg'$  Advised to maintain DASH diet F/up in 30month  Reviewed medical, surgical, and social history today  Medications: Outpatient Medications Prior to Visit  Medication Sig   Ascorbic Acid (VITAMIN C PO) Take by mouth.   b complex vitamins tablet Take 1 tablet by mouth daily.   Cholecalciferol (VITAMIN D3 PO) Take by mouth.   diclofenac Sodium (VOLTAREN) 1 % GEL Apply 4 g topically 4 (four) times daily. Apply to affected areas 4 times daily as needed for pain.   lidocaine (LIDODERM) 5 % Place 1 patch onto the skin daily. Remove & Discard patch within 12 hours or as directed by MD   Zinc Acetate, Oral, (ZINC ACETATE PO) Take by mouth.   [DISCONTINUED] lisinopril (ZESTRIL) 30 MG tablet Take 1 tablet (30 mg total) by mouth daily.   lisinopril (ZESTRIL) 40 MG tablet Take 1 tablet (40 mg total) by mouth daily.   No facility-administered medications prior to visit.   Reviewed past medical and social history.   ROS per HPI above      Objective:  BP (!) 140/70   Pulse 63   Temp (!) 97.2 F (36.2 C) (Temporal)   Ht '5\' 4"'$  (1.626 m)   Wt 222 lb 12.8 oz (101.1 kg)   SpO2 98%   BMI 38.24 kg/m      Physical Exam Cardiovascular:     Rate and Rhythm: Normal rate and regular rhythm.     Pulses: Normal pulses.     Heart sounds: Normal heart sounds.  Pulmonary:     Effort: Pulmonary effort is normal.     Breath sounds: Normal breath sounds.  Musculoskeletal:     Right lower leg: No edema.     Left lower leg: No edema.  Neurological:      Mental Status: She is alert.     No results found for any visits on 06/07/22.    Assessment & Plan:    Problem List Items Addressed This Visit       Cardiovascular and Mediastinum   Essential hypertension - Primary    BP not at goal BP Readings from Last 3 Encounters:  06/07/22 (!) 140/70  05/15/22 (!) 154/100  05/12/22 (!) 143/81   Increase lisinopril to '40mg'$  Advised to maintain DASH diet F/up in 145month    Relevant Medications   lisinopril (ZESTRIL) 40 MG tablet   Return in about 4 weeks (around 07/05/2022) for HTN.     ChWilfred LacyNP

## 2022-06-07 NOTE — Patient Instructions (Signed)
Increase lisinopril to '40mg'$  daily Maintain DASH diet Monitor BP every AM Bring BP machine to next appt

## 2022-06-07 NOTE — Assessment & Plan Note (Signed)
BP not at goal BP Readings from Last 3 Encounters:  06/07/22 (!) 140/70  05/15/22 (!) 154/100  05/12/22 (!) 143/81   Increase lisinopril to '40mg'$  Advised to maintain DASH diet F/up in 53month

## 2022-06-14 ENCOUNTER — Encounter: Payer: Self-pay | Admitting: Family Medicine

## 2022-06-14 ENCOUNTER — Ambulatory Visit (INDEPENDENT_AMBULATORY_CARE_PROVIDER_SITE_OTHER): Payer: 59 | Admitting: Family Medicine

## 2022-06-14 VITALS — BP 172/84 | Ht 64.0 in | Wt 222.0 lb

## 2022-06-14 DIAGNOSIS — M7672 Peroneal tendinitis, left leg: Secondary | ICD-10-CM | POA: Insufficient documentation

## 2022-06-14 DIAGNOSIS — M23203 Derangement of unspecified medial meniscus due to old tear or injury, right knee: Secondary | ICD-10-CM | POA: Diagnosis not present

## 2022-06-14 NOTE — Patient Instructions (Signed)
Good to see you Please try the exercises  Please try the recumbent bike  Please try ice on the knee and heat on the leg  Please send me a message in MyChart with any questions or updates.  Please see me back in 6 weeks or as needed if doing well.   --Dr. Raeford Razor

## 2022-06-14 NOTE — Assessment & Plan Note (Signed)
New onset.  Likely compensating for the right knee has led to the development of this pain. -Counseled on home exercise therapy and supportive care. -Could consider shockwave therapy or physical therapy.

## 2022-06-14 NOTE — Assessment & Plan Note (Signed)
Has gotten improvement with the modalities in place.  Does notice a small pain from time to time. -Counseled on home exercise therapy and supportive care. -Could consider injection or physical therapy

## 2022-06-14 NOTE — Progress Notes (Signed)
  [name] - 56 y.o. female MRN [id number]  Date of birth: 1966/05/18  SUBJECTIVE:  Including CC & ROS.  No chief complaint on file.   Tina Scott is a 56 y.o. female that is following up for her right knee pain and presenting with new onset left lower leg pain.  Left lower leg pain has been ongoing for a few days.  She noticed it before walking and did get improvement with activity.   Review of Systems See HPI   HISTORY: Past Medical, Surgical, Social, and Family History Reviewed & Updated per EMR.   Pertinent Historical Findings include:  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   Left Shoulder - anterior    Past Surgical History:  Procedure Laterality Date   CESAREAN SECTION     x2   COLONOSCOPY     HERNIA REPAIR     Hernia Revision also.    POLYPECTOMY     TUBAL LIGATION       PHYSICAL EXAM:  VS: BP (!) 172/84 (BP Location: Left Arm, Patient Position: Sitting)   Ht '5\' 4"'$  (1.626 m)   Wt 222 lb (100.7 kg)   BMI 38.11 kg/m  Physical Exam Gen: NAD, alert, cooperative with exam, well-appearing MSK:  Neurovascularly intact       ASSESSMENT & PLAN:   Peroneal tendinitis of left lower leg New onset.  Likely compensating for the right knee has led to the development of this pain. -Counseled on home exercise therapy and supportive care. -Could consider shockwave therapy or physical therapy.  Degenerative tear of medial meniscus of right knee Has gotten improvement with the modalities in place.  Does notice a small pain from time to time. -Counseled on home exercise therapy and supportive care. -Could consider injection or physical therapy

## 2022-06-28 DIAGNOSIS — M9906 Segmental and somatic dysfunction of lower extremity: Secondary | ICD-10-CM | POA: Diagnosis not present

## 2022-06-28 DIAGNOSIS — M25851 Other specified joint disorders, right hip: Secondary | ICD-10-CM | POA: Diagnosis not present

## 2022-06-28 DIAGNOSIS — M7918 Myalgia, other site: Secondary | ICD-10-CM | POA: Diagnosis not present

## 2022-06-28 DIAGNOSIS — S83241A Other tear of medial meniscus, current injury, right knee, initial encounter: Secondary | ICD-10-CM | POA: Diagnosis not present

## 2022-06-28 DIAGNOSIS — M25551 Pain in right hip: Secondary | ICD-10-CM | POA: Diagnosis not present

## 2022-06-28 DIAGNOSIS — M25561 Pain in right knee: Secondary | ICD-10-CM | POA: Diagnosis not present

## 2022-06-28 DIAGNOSIS — M1711 Unilateral primary osteoarthritis, right knee: Secondary | ICD-10-CM | POA: Diagnosis not present

## 2022-06-28 DIAGNOSIS — M25671 Stiffness of right ankle, not elsewhere classified: Secondary | ICD-10-CM | POA: Diagnosis not present

## 2022-07-06 ENCOUNTER — Other Ambulatory Visit (HOSPITAL_COMMUNITY): Payer: Self-pay

## 2022-07-06 ENCOUNTER — Ambulatory Visit: Payer: 59 | Admitting: Nurse Practitioner

## 2022-07-06 ENCOUNTER — Other Ambulatory Visit (HOSPITAL_BASED_OUTPATIENT_CLINIC_OR_DEPARTMENT_OTHER): Payer: Self-pay

## 2022-07-06 ENCOUNTER — Encounter: Payer: Self-pay | Admitting: Nurse Practitioner

## 2022-07-06 VITALS — BP 140/72 | HR 62 | Temp 97.1°F | Ht 64.0 in | Wt 221.2 lb

## 2022-07-06 DIAGNOSIS — I10 Essential (primary) hypertension: Secondary | ICD-10-CM

## 2022-07-06 DIAGNOSIS — F411 Generalized anxiety disorder: Secondary | ICD-10-CM

## 2022-07-06 DIAGNOSIS — M25551 Pain in right hip: Secondary | ICD-10-CM | POA: Diagnosis not present

## 2022-07-06 DIAGNOSIS — M7918 Myalgia, other site: Secondary | ICD-10-CM | POA: Diagnosis not present

## 2022-07-06 DIAGNOSIS — M25561 Pain in right knee: Secondary | ICD-10-CM | POA: Diagnosis not present

## 2022-07-06 DIAGNOSIS — M25671 Stiffness of right ankle, not elsewhere classified: Secondary | ICD-10-CM | POA: Diagnosis not present

## 2022-07-06 DIAGNOSIS — S83241A Other tear of medial meniscus, current injury, right knee, initial encounter: Secondary | ICD-10-CM | POA: Diagnosis not present

## 2022-07-06 DIAGNOSIS — M1711 Unilateral primary osteoarthritis, right knee: Secondary | ICD-10-CM | POA: Diagnosis not present

## 2022-07-06 DIAGNOSIS — M9906 Segmental and somatic dysfunction of lower extremity: Secondary | ICD-10-CM | POA: Diagnosis not present

## 2022-07-06 DIAGNOSIS — M25851 Other specified joint disorders, right hip: Secondary | ICD-10-CM | POA: Diagnosis not present

## 2022-07-06 MED ORDER — LISINOPRIL 40 MG PO TABS
40.0000 mg | ORAL_TABLET | Freq: Every day | ORAL | 1 refills | Status: DC
  Filled 2022-07-06: qty 90, 90d supply, fill #0
  Filled 2022-10-02: qty 90, 90d supply, fill #1

## 2022-07-06 MED ORDER — AMLODIPINE BESYLATE 5 MG PO TABS
5.0000 mg | ORAL_TABLET | Freq: Every day | ORAL | 5 refills | Status: DC
  Filled 2022-07-06: qty 30, 30d supply, fill #0
  Filled 2022-07-28 – 2022-07-31 (×2): qty 30, 30d supply, fill #1

## 2022-07-06 MED ORDER — ESCITALOPRAM OXALATE 10 MG PO TABS
10.0000 mg | ORAL_TABLET | Freq: Every day | ORAL | 5 refills | Status: DC
  Filled 2022-07-06: qty 30, 30d supply, fill #0
  Filled 2022-07-28 – 2022-07-31 (×2): qty 30, 30d supply, fill #1

## 2022-07-06 NOTE — Assessment & Plan Note (Signed)
Improving BP but not at goal Home BP average in last 2weeks: 180/103 Reports she in compliant with lisinopril dose BP Readings from Last 3 Encounters:  07/06/22 (!) 140/72  06/14/22 (!) 172/84  06/07/22 (!) 140/70   Maintain lisinopril dose Add amlodipine '5mg'$  in PM F/up in 67month

## 2022-07-06 NOTE — Assessment & Plan Note (Signed)
Chronic, waxing and waning, worsening in last 52month due to husband's health, financial strain and work stress. No SI/HI, no Hx of IVC No FHx of mental health disorder.  Agreed to start lexapro and referral to therapist F/up in 141month

## 2022-07-06 NOTE — Patient Instructions (Signed)
Add amlodipine '5mg'$  in PM Conatinue to monitor BP reading in AM every other day Start 1/2 tablet once daily for 1 week and then increase to a full tablet once daily continuously  Plan follow up in 1 month to evaluate progress.

## 2022-07-06 NOTE — Progress Notes (Signed)
Established Patient Visit  Patient: ARLYNE BRANDES   DOB: [birth year]   56 y.o. Female  MRN: [id number] Visit Date: 07/06/2022  Subjective:    Chief Complaint  Patient presents with   Office Visit    HTN F/u Checks BP daily  C/o of having high anxiety still    HPI No problem-specific Assessment & Plan notes found for this encounter.     07/06/2022    8:55 AM 11/30/2021    9:07 AM 11/24/2020   10:29 AM  Depression screen PHQ 2/9  Decreased Interest 1 1 0  Down, Depressed, Hopeless _0 PHQ - 2 Score _1 Altered sleeping _2 Tired, decreased energy _3 Change in appetite 2 0 0  Feeling bad or failure about yourself  3 0 1  Trouble concentrating 2 0 0  Moving slowly or fidgety/restless 0 0 0  Suicidal thoughts 0 0 0  PHQ-9 Score _4 Difficult doing work/chores Somewhat difficult Not difficult at all Somewhat difficult       07/06/2022    8:55 AM 11/30/2021    9:07 AM 11/24/2020   10:28 AM  GAD 7 : Generalized Anxiety Score  Nervous, Anxious, on Edge _5 Control/stop worrying _6 Worry too much - different things 2 1 0  Trouble relaxing _7 Restless 1 1 0  Easily annoyed or irritable _8 Afraid - awful might happen _9 Total GAD 7 Score _10 Anxiety Difficulty Somewhat difficult Not difficult at all Somewhat difficult     Reviewed medical, surgical, and social history today  Medications: Outpatient Medications Prior to Visit  Medication Sig   Ascorbic Acid (VITAMIN C PO) Take by mouth.   b complex vitamins tablet Take 1 tablet by mouth daily.   Cholecalciferol (VITAMIN D3 PO) Take by mouth.   diclofenac Sodium (VOLTAREN) 1 % GEL Apply 4 g topically 4 (four) times daily. Apply to affected areas 4 times daily as needed for pain.   lidocaine (LIDODERM) 5 % Place 1 patch onto the skin daily. Remove & Discard patch within 12 hours or as directed by MD   Zinc Acetate, Oral, (ZINC ACETATE PO) Take by mouth.    [DISCONTINUED] lisinopril (ZESTRIL) 40 MG tablet Take 1 tablet (40 mg total) by mouth daily.   No facility-administered medications prior to visit.   Reviewed past medical and social history.   ROS per HPI above  Last CBC Lab Results  Component Value Date   WBC 6.8 05/12/2022   HGB 14.2 05/12/2022   HCT 43.4 05/12/2022   MCV 87 05/12/2022   MCH 28.4 05/12/2022   RDW 13.0 05/12/2022   PLT 338 98/33/8250   Last metabolic panel Lab Results  Component Value Date   GLUCOSE 86 05/12/2022   NA 140 05/12/2022   K 4.4 05/12/2022   CL 103 05/12/2022   CO2 24 05/12/2022   BUN 10 05/12/2022   CREATININE 0.74 05/12/2022   EGFR 95 05/12/2022   CALCIUM 11.0 (H) 05/12/2022   PROT 7.7 05/12/2022   ALBUMIN 4.8 05/12/2022   LABGLOB 2.9 05/12/2022   AGRATIO 1.7 05/12/2022   BILITOT 0.4 05/12/2022   ALKPHOS 105 05/12/2022   AST 12 05/12/2022   ALT 12 05/12/2022   Last thyroid functions Lab  Results  Component Value Date   TSH 2.10 11/24/2020      Objective:  BP (!) 140/72   Pulse 62   Temp (!) 97.1 F (36.2 C) (Temporal)   Ht _0  (1.626 m)   Wt 221 lb 3.2 oz (100.3 kg)   SpO2 98%   BMI 37.97 kg/m      Physical Exam Constitutional:      Appearance: She is obese.  Cardiovascular:     Rate and Rhythm: Normal rate.     Pulses: Normal pulses.  Pulmonary:     Effort: Pulmonary effort is normal.  Neurological:     Mental Status: She is alert and oriented to person, place, and time.  Psychiatric:        Mood and Affect: Mood is anxious. Affect is tearful.        Speech: Speech normal.        Behavior: Behavior is cooperative.        Thought Content: Thought content normal.        Cognition and Memory: Cognition and memory normal.     No results found for any visits on 07/06/22.    Assessment & Plan:    Problem List Items Addressed This Visit       Cardiovascular and Mediastinum   Essential hypertension - Primary   Relevant Medications   amLODipine (NORVASC)  5 MG tablet   lisinopril (ZESTRIL) 40 MG tablet   Other Visit Diagnoses     GAD (generalized anxiety disorder)       Relevant Medications   escitalopram (LEXAPRO) 10 MG tablet      Return in about 4 weeks (around 08/03/2022) for HTN, depression and anxiety (video).     Wilfred Lacy, NP

## 2022-07-11 DIAGNOSIS — M25671 Stiffness of right ankle, not elsewhere classified: Secondary | ICD-10-CM | POA: Diagnosis not present

## 2022-07-11 DIAGNOSIS — M9906 Segmental and somatic dysfunction of lower extremity: Secondary | ICD-10-CM | POA: Diagnosis not present

## 2022-07-11 DIAGNOSIS — S83241A Other tear of medial meniscus, current injury, right knee, initial encounter: Secondary | ICD-10-CM | POA: Diagnosis not present

## 2022-07-11 DIAGNOSIS — M25561 Pain in right knee: Secondary | ICD-10-CM | POA: Diagnosis not present

## 2022-07-11 DIAGNOSIS — M7918 Myalgia, other site: Secondary | ICD-10-CM | POA: Diagnosis not present

## 2022-07-11 DIAGNOSIS — M25851 Other specified joint disorders, right hip: Secondary | ICD-10-CM | POA: Diagnosis not present

## 2022-07-11 DIAGNOSIS — M25551 Pain in right hip: Secondary | ICD-10-CM | POA: Diagnosis not present

## 2022-07-11 DIAGNOSIS — M1711 Unilateral primary osteoarthritis, right knee: Secondary | ICD-10-CM | POA: Diagnosis not present

## 2022-07-17 DIAGNOSIS — M25851 Other specified joint disorders, right hip: Secondary | ICD-10-CM | POA: Diagnosis not present

## 2022-07-17 DIAGNOSIS — S83241A Other tear of medial meniscus, current injury, right knee, initial encounter: Secondary | ICD-10-CM | POA: Diagnosis not present

## 2022-07-17 DIAGNOSIS — M25551 Pain in right hip: Secondary | ICD-10-CM | POA: Diagnosis not present

## 2022-07-17 DIAGNOSIS — M25671 Stiffness of right ankle, not elsewhere classified: Secondary | ICD-10-CM | POA: Diagnosis not present

## 2022-07-17 DIAGNOSIS — M25561 Pain in right knee: Secondary | ICD-10-CM | POA: Diagnosis not present

## 2022-07-17 DIAGNOSIS — M9906 Segmental and somatic dysfunction of lower extremity: Secondary | ICD-10-CM | POA: Diagnosis not present

## 2022-07-17 DIAGNOSIS — M7918 Myalgia, other site: Secondary | ICD-10-CM | POA: Diagnosis not present

## 2022-07-17 DIAGNOSIS — M1711 Unilateral primary osteoarthritis, right knee: Secondary | ICD-10-CM | POA: Diagnosis not present

## 2022-07-26 DIAGNOSIS — M25851 Other specified joint disorders, right hip: Secondary | ICD-10-CM | POA: Diagnosis not present

## 2022-07-26 DIAGNOSIS — M7918 Myalgia, other site: Secondary | ICD-10-CM | POA: Diagnosis not present

## 2022-07-26 DIAGNOSIS — M1711 Unilateral primary osteoarthritis, right knee: Secondary | ICD-10-CM | POA: Diagnosis not present

## 2022-07-26 DIAGNOSIS — S83241A Other tear of medial meniscus, current injury, right knee, initial encounter: Secondary | ICD-10-CM | POA: Diagnosis not present

## 2022-07-26 DIAGNOSIS — M25551 Pain in right hip: Secondary | ICD-10-CM | POA: Diagnosis not present

## 2022-07-26 DIAGNOSIS — M25561 Pain in right knee: Secondary | ICD-10-CM | POA: Diagnosis not present

## 2022-07-26 DIAGNOSIS — M9906 Segmental and somatic dysfunction of lower extremity: Secondary | ICD-10-CM | POA: Diagnosis not present

## 2022-07-26 DIAGNOSIS — M25671 Stiffness of right ankle, not elsewhere classified: Secondary | ICD-10-CM | POA: Diagnosis not present

## 2022-07-28 ENCOUNTER — Encounter: Payer: Self-pay | Admitting: Family Medicine

## 2022-07-28 ENCOUNTER — Other Ambulatory Visit (HOSPITAL_BASED_OUTPATIENT_CLINIC_OR_DEPARTMENT_OTHER): Payer: Self-pay

## 2022-07-28 ENCOUNTER — Ambulatory Visit: Payer: Self-pay

## 2022-07-28 ENCOUNTER — Ambulatory Visit (INDEPENDENT_AMBULATORY_CARE_PROVIDER_SITE_OTHER): Payer: 59 | Admitting: Family Medicine

## 2022-07-28 VITALS — BP 128/74 | Ht 64.0 in | Wt 220.0 lb

## 2022-07-28 DIAGNOSIS — M23203 Derangement of unspecified medial meniscus due to old tear or injury, right knee: Secondary | ICD-10-CM | POA: Diagnosis not present

## 2022-07-28 MED ORDER — TRIAMCINOLONE ACETONIDE 40 MG/ML IJ SUSP
40.0000 mg | Freq: Once | INTRAMUSCULAR | Status: AC
  Administered 2022-07-28: 40 mg via INTRA_ARTICULAR

## 2022-07-28 NOTE — Addendum Note (Signed)
Addended by: Marva Panda on: 07/28/2022 11:35 AM   Modules accepted: Orders

## 2022-07-28 NOTE — Assessment & Plan Note (Signed)
Acute worsening of her underlying knee pain. -Counseled on home exercise therapy and supportive care. -Refer to physical therapy. -Injection today. -Could consider further imaging or gel injection.

## 2022-07-28 NOTE — Progress Notes (Signed)
  Tina Scott - 56 y.o. female MRN [id number]  Date of birth: [birth year]  SUBJECTIVE:  Including CC & ROS.  No chief complaint on file.   Tina Scott is a 56 y.o. female that is presenting with acute worsening of her right knee pain.  The pain is causing her to limp.  She has been using a brace.  It is also keeping her up at night.    Review of Systems See HPI   HISTORY: Past Medical, Surgical, Social, and Family History Reviewed & Updated per EMR.   Pertinent Historical Findings include:  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   Left Shoulder - anterior    Past Surgical History:  Procedure Laterality Date   CESAREAN SECTION     x2   COLONOSCOPY     HERNIA REPAIR     Hernia Revision also.    POLYPECTOMY     TUBAL LIGATION       PHYSICAL EXAM:  VS: BP 128/74   Ht '5\' 4"'$  (1.626 m)   Wt 220 lb (99.8 kg)   BMI 37.76 kg/m  Physical Exam Gen: NAD, alert, cooperative with exam, well-appearing MSK:  Neurovascularly intact     Aspiration/Injection Procedure Note RONNITA PAZ May 28, 1966  Procedure: Injection Indications: Right knee pain  Procedure Details Consent: Risks of procedure as well as the alternatives and risks of each were explained to the (patient/caregiver).  Consent for procedure obtained. Time Out: Verified patient identification, verified procedure, site/side was marked, verified correct patient position, special equipment/implants available, medications/allergies/relevent history reviewed, required imaging and test results available.  Performed.  The area was cleaned with iodine and alcohol swabs.    The right knee superior lateral suprapatellar pouch was injected using 3 cc 1% lidocaine on a 22-gauge 1-1/2 inch needle.  The syringe was switched to mixture containing 1 cc's of 40 mg Kenalog and 4 cc's of 0.25% bupivacaine was injected.  Ultrasound  was used. Images were obtained in long views showing the injection.     A sterile dressing was applied.  Patient did tolerate procedure well.     ASSESSMENT & PLAN:   Degenerative tear of medial meniscus of right knee Acute worsening of her underlying knee pain. -Counseled on home exercise therapy and supportive care. -Refer to physical therapy. -Injection today. -Could consider further imaging or gel injection.

## 2022-07-28 NOTE — Patient Instructions (Signed)
Good to see you Please use ice as needed  I have made a referral to physical therapy   Please send me a message in MyChart with any questions or updates.  Please see me back in 6-8 weeks.   --Dr. Raeford Razor

## 2022-07-31 ENCOUNTER — Other Ambulatory Visit (HOSPITAL_BASED_OUTPATIENT_CLINIC_OR_DEPARTMENT_OTHER): Payer: Self-pay

## 2022-08-08 ENCOUNTER — Other Ambulatory Visit (HOSPITAL_BASED_OUTPATIENT_CLINIC_OR_DEPARTMENT_OTHER): Payer: Self-pay

## 2022-08-08 ENCOUNTER — Encounter: Payer: Self-pay | Admitting: Nurse Practitioner

## 2022-08-08 ENCOUNTER — Telehealth (INDEPENDENT_AMBULATORY_CARE_PROVIDER_SITE_OTHER): Payer: 59 | Admitting: Nurse Practitioner

## 2022-08-08 VITALS — BP 127/77 | Wt 220.0 lb

## 2022-08-08 DIAGNOSIS — I10 Essential (primary) hypertension: Secondary | ICD-10-CM | POA: Diagnosis not present

## 2022-08-08 DIAGNOSIS — F411 Generalized anxiety disorder: Secondary | ICD-10-CM | POA: Diagnosis not present

## 2022-08-08 MED ORDER — ESCITALOPRAM OXALATE 10 MG PO TABS
10.0000 mg | ORAL_TABLET | Freq: Every day | ORAL | 3 refills | Status: DC
  Filled 2022-08-08 – 2022-08-29 (×2): qty 90, 90d supply, fill #0
  Filled 2022-11-28: qty 90, 90d supply, fill #1
  Filled 2023-03-02: qty 90, 90d supply, fill #2
  Filled 2023-05-30: qty 90, 90d supply, fill #3

## 2022-08-08 MED ORDER — AMLODIPINE BESYLATE 5 MG PO TABS
5.0000 mg | ORAL_TABLET | Freq: Every day | ORAL | 3 refills | Status: DC
  Filled 2022-08-08 – 2022-08-29 (×2): qty 90, 90d supply, fill #0
  Filled 2022-11-28: qty 90, 90d supply, fill #1
  Filled 2023-03-02: qty 90, 90d supply, fill #2
  Filled 2023-05-30: qty 90, 90d supply, fill #3

## 2022-08-08 NOTE — Progress Notes (Signed)
Virtual Visit via Video Note  I connected withNAME@ on 08/08/22 at  8:20 AM EST by a video enabled telemedicine application and verified that I am speaking with the correct person using two identifiers.  Location: Patient:Home Provider: Office Participants: patient and provider  I discussed the limitations of evaluation and management by telemedicine and the availability of in person appointments. I also discussed with the patient that there may be a patient responsible charge related to this service. The patient expressed understanding and agreed to proceed.  DZ:HGDJMEQ and HTN f/up. Checks BP every other day, says it has been pretty normal 120's / 70's   History of Present Illness:  Essential hypertension BP at goal with amlodipine and lisinopril Denies any adverse side effects. BP Readings from Last 3 Encounters:  08/08/22 127/77  07/28/22 128/74  07/06/22 (!) 140/72    Maintain med doses F/up in 27month  GAD (generalized anxiety disorder) Improved mood with lexapro. Pending appt with psychologist.  Maintain med dose F/up in 422monthor sooner if needed      08/08/2022    8:16 AM 07/06/2022    8:55 AM 11/30/2021    9:07 AM  Depression screen PHQ 2/9  Decreased Interest '1 1 1  '$ Down, Depressed, Hopeless '1 1 1  '$ PHQ - 2 Score '2 2 2  '$ Altered sleeping '3 2 1  '$ Tired, decreased energy '1 1 1  '$ Change in appetite 0 2 0  Feeling bad or failure about yourself  2 3 0  Trouble concentrating 0 2 0  Moving slowly or fidgety/restless 0 0 0  Suicidal thoughts 0 0 0  PHQ-9 Score '8 12 4  '$ Difficult doing work/chores Not difficult at all Somewhat difficult Not difficult at all       08/08/2022    8:17 AM 07/06/2022    8:55 AM 11/30/2021    9:07 AM 11/24/2020   10:28 AM  GAD 7 : Generalized Anxiety Score  Nervous, Anxious, on Edge '1 2 1 1  '$ Control/stop worrying '1 2 1 1  '$ Worry too much - different things '1 2 1 '$ 0  Trouble relaxing '1 2 1 1  '$ Restless '1 1 1 '$ 0  Easily annoyed or  irritable 0 '2 1 1  '$ Afraid - awful might happen 0 '1 1 1  '$ Total GAD 7 Score '5 12 7 5  '$ Anxiety Difficulty Not difficult at all Somewhat difficult Not difficult at all Somewhat difficult   Observations/Objective: Physical Exam Cardiovascular:     Rate and Rhythm: Normal rate.     Pulses: Normal pulses.  Neurological:     Mental Status: She is alert and oriented to person, place, and time.  Psychiatric:        Mood and Affect: Mood normal.        Behavior: Behavior normal.        Thought Content: Thought content normal.    Assessment and Plan: AlMontserrathas seen today for office visit.  Diagnoses and all orders for this visit:  Essential hypertension -     amLODipine (NORVASC) 5 MG tablet; Take 1 tablet (5 mg total) by mouth at bedtime.  GAD (generalized anxiety disorder) -     escitalopram (LEXAPRO) 10 MG tablet; Take 1 tablet (10 mg total) by mouth daily.   Follow Up Instructions: See instructions above   I discussed the assessment and treatment plan with the patient. The patient was provided an opportunity to ask questions and all were answered. The patient agreed with the plan  and demonstrated an understanding of the instructions.   The patient was advised to call back or seek an in-person evaluation if the symptoms worsen or if the condition fails to improve as anticipated.  Wilfred Lacy, NP

## 2022-08-08 NOTE — Assessment & Plan Note (Signed)
Improved mood with lexapro. Pending appt with psychologist.  Maintain med dose F/up in 36month or sooner if needed

## 2022-08-08 NOTE — Patient Instructions (Signed)
Maintain current medications F/up in 40month

## 2022-08-08 NOTE — Assessment & Plan Note (Signed)
BP at goal with amlodipine and lisinopril Denies any adverse side effects. BP Readings from Last 3 Encounters:  08/08/22 127/77  07/28/22 128/74  07/06/22 (!) 140/72    Maintain med doses F/up in 31month

## 2022-08-12 IMAGING — CR DG ABDOMEN 1V
2 series · 2 of 2 positions shown · non-contrast
Comparison: CT abdomen and pelvis 02/27/2007.

CLINICAL DATA: Hyperparathyroidism.  Evaluate for renal stones.

EXAM:
ABDOMEN - 1 VIEW

[t abdomen supine (1 of 2)]
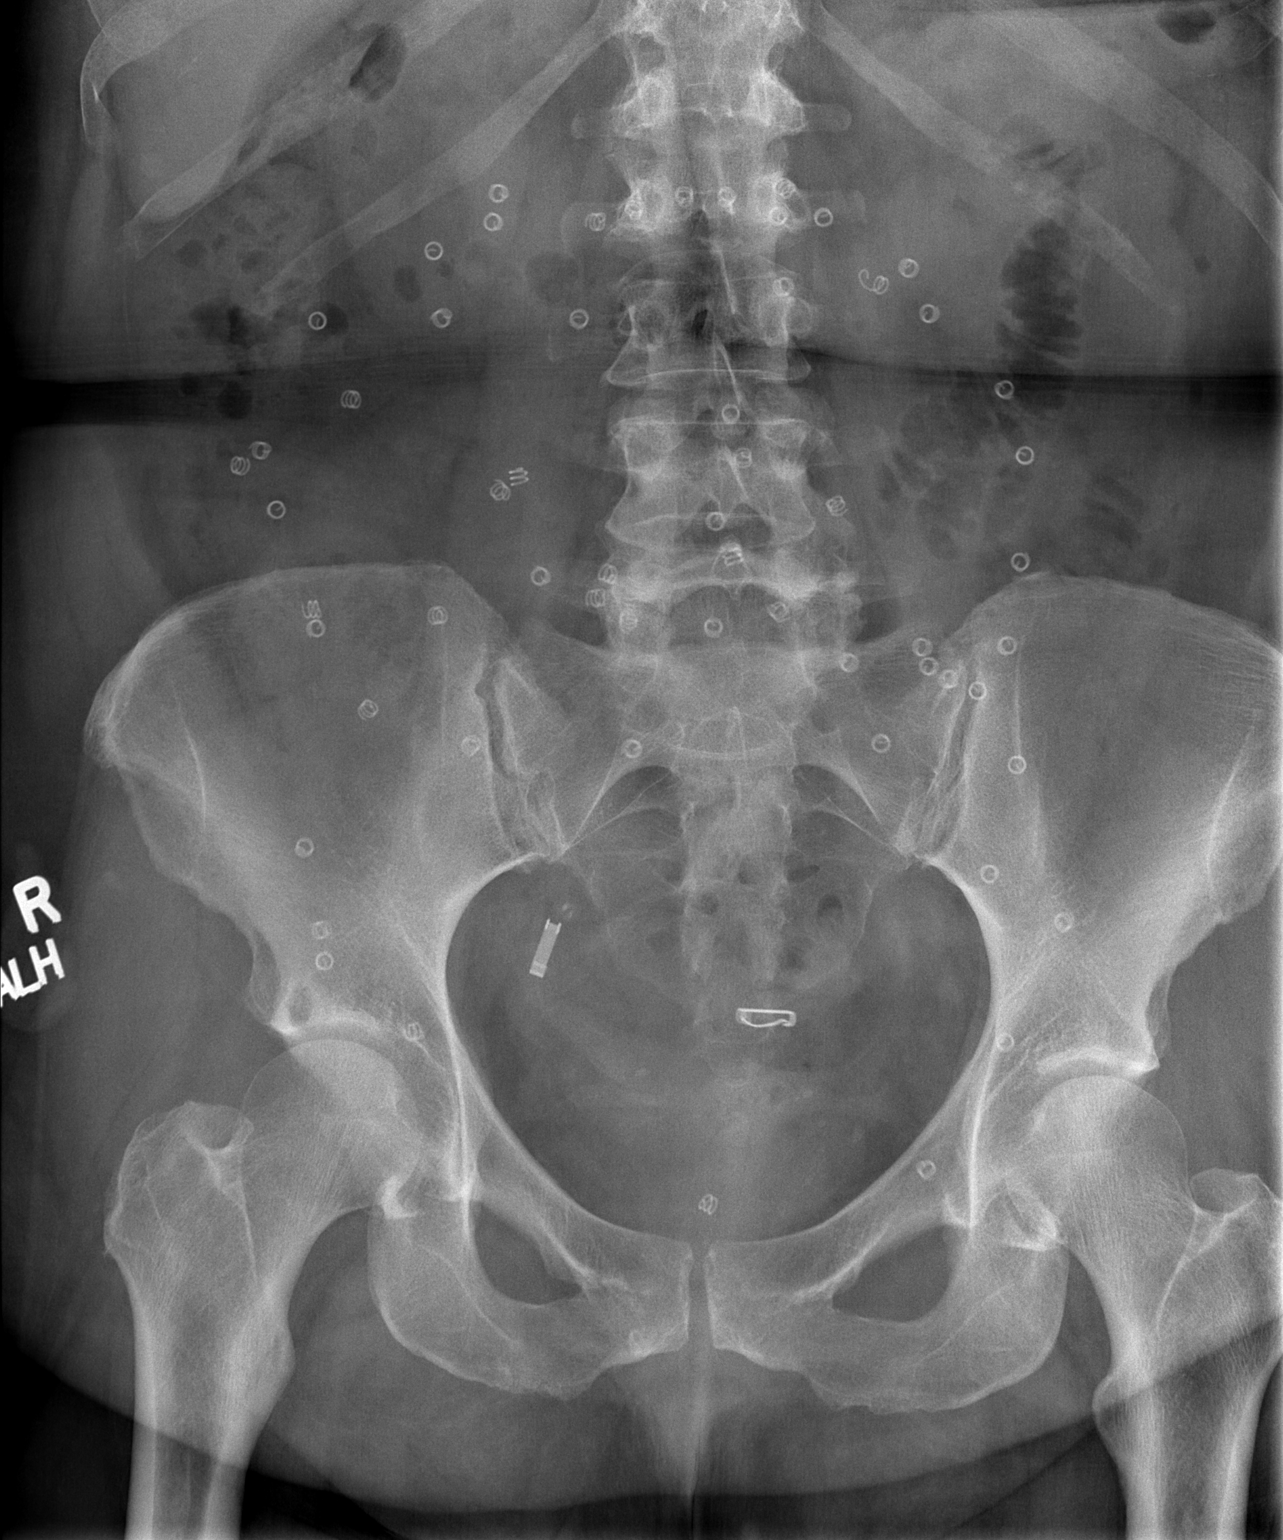

[t abdomen supine (2 of 2)]
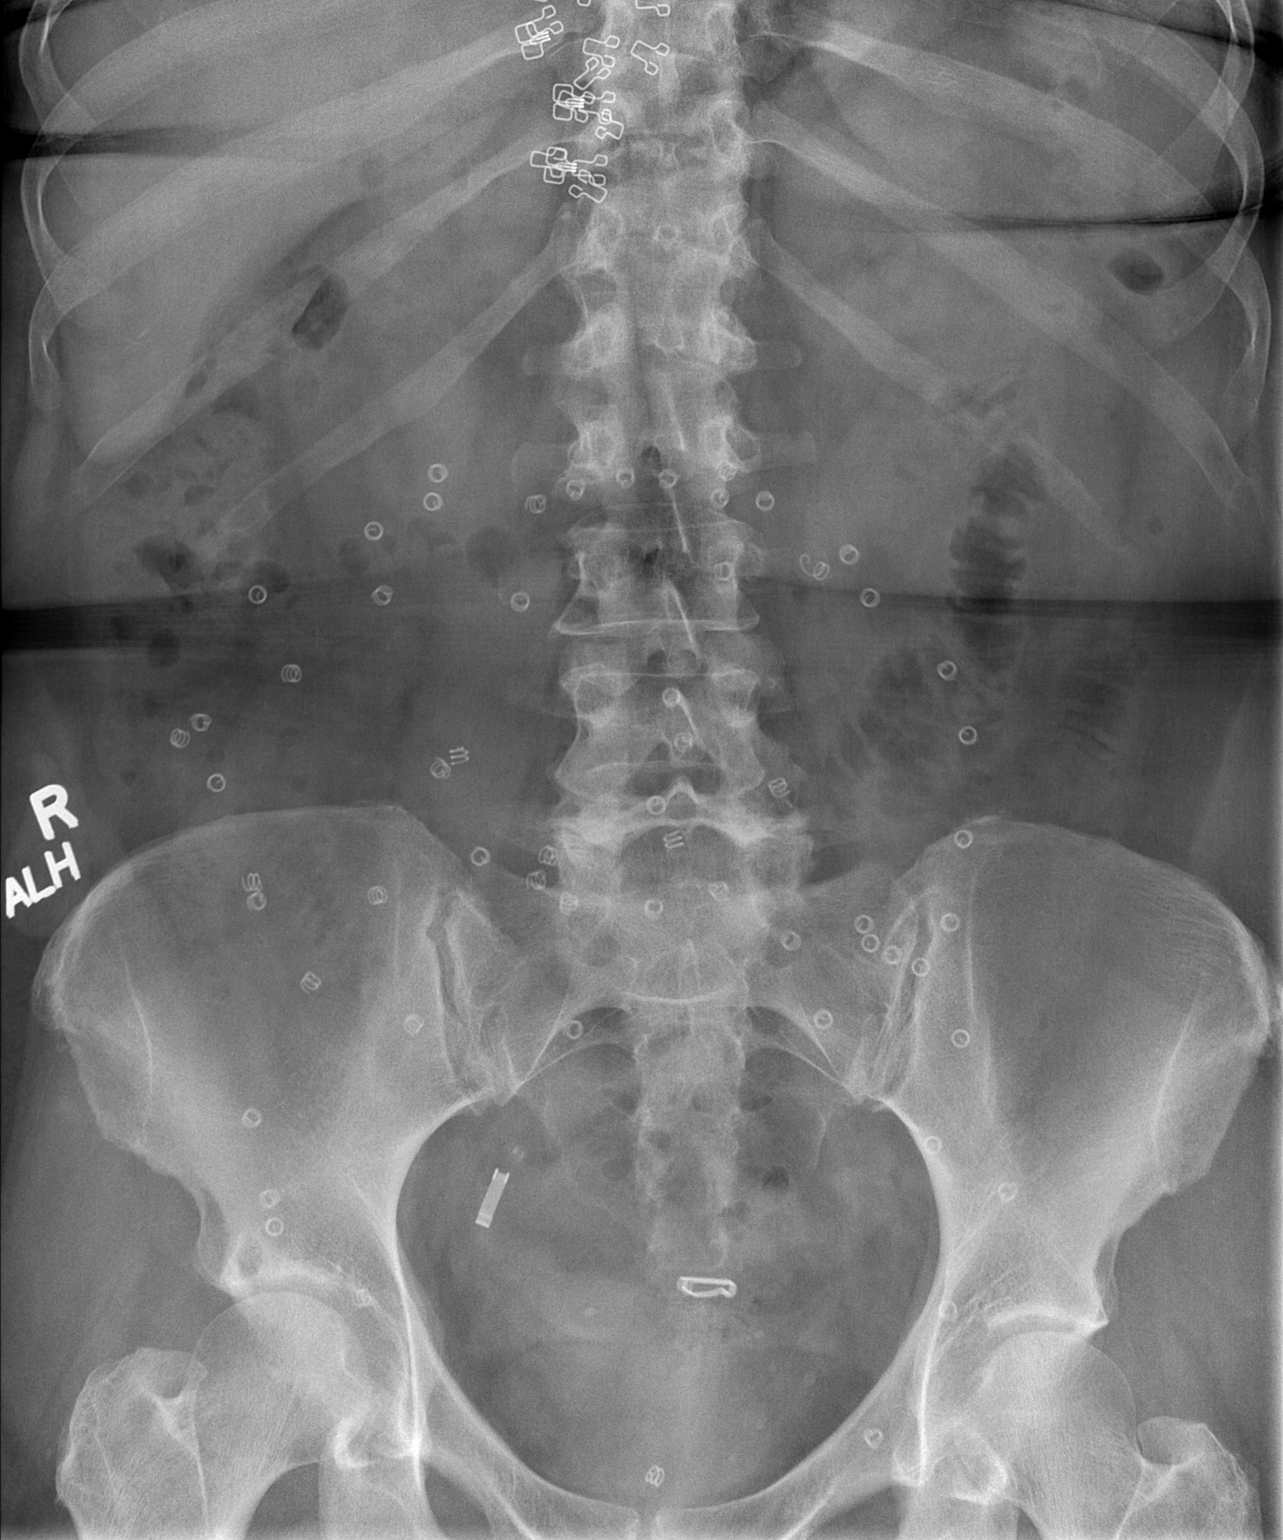

[2 of 2 positions shown; findings below may reference images not displayed]

FINDINGS: Bowel-gas pattern is nonobstructive. Surgical coils and clips
overlie the abdomen and pelvis. There are no calcifications
overlying the course of the kidneys or ureters. There are few
punctate calcifications in the pelvis which may be vascular. Osseous
structures are within normal limits.
IMPRESSION: 1. No evidence for renal calculi.
2. Small calcifications in the pelvis may be vascular in origin.
Distal ureteral or bladder calculi are not excluded.
3. Nonobstructive bowel gas pattern.

## 2022-08-16 ENCOUNTER — Ambulatory Visit (HOSPITAL_COMMUNITY): Payer: Self-pay | Admitting: Psychiatry

## 2022-08-16 NOTE — Progress Notes (Signed)
OUTPATIENT PHYSICAL THERAPY LOWER EXTREMITY EVALUATION   Patient Name: Tina Scott MRN: [id number] DOB:1966/04/04, 56 y.o., female Today's Date: 08/17/2022  END OF SESSION:  PT End of Session - 08/17/22 1051     Visit Number 1    Date for PT Re-Evaluation 11/09/22    PT Start Time 1050    PT Stop Time 1135    PT Time Calculation (min) 45 min    Activity Tolerance Patient tolerated treatment well    Behavior During Therapy [facility] for tasks assessed/performed             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   Left Shoulder - anterior   Past Surgical History:  Procedure Laterality Date   CESAREAN SECTION     x2   COLONOSCOPY     HERNIA REPAIR     Hernia Revision also.    POLYPECTOMY     TUBAL LIGATION     Patient Active Problem List   Diagnosis Date Noted   GAD (generalized anxiety disorder) 07/06/2022   Peroneal tendinitis of left lower leg 06/14/2022   Degenerative tear of medial meniscus of right knee 05/15/2022   Pseudopolyposis of colon without complication, unspecified part of colon ([place]) 11/30/2021   Vitamin D insufficiency 06/16/2021   Hyperparathyroidism ([place]) 03/09/2021   Hypercalcemia 11/25/2020   Hx of basal cell carcinoma excision 11/24/2020   Hx of colonic polyp 05/25/2020   Pure hypercholesterolemia 05/25/2020   Carpal tunnel syndrome on right 01/02/2017   Essential hypertension 08/18/2016    PCP: Flossie Buffy, NP  REFERRING PROVIDER: Rosemarie Ax, MD  REFERRING DIAG: [id number] (ICD-10-CM) - Degenerative tear of medial meniscus of right knee   THERAPY DIAG:  Difficulty in walking, not elsewhere classified  Other lack of coordination  Muscle weakness (generalized)  Chronic pain of right knee  Rationale for Evaluation and Treatment: Rehabilitation  ONSET DATE: 07/28/22  SUBJECTIVE:   SUBJECTIVE STATEMENT: Patient reports  fluctuating pain in her R knee starting over the summer. The pain has increased in intensity and has not been relieved unless she had multi day rest. She was diagnosed with meniscus tear and received injection. She works on the floor at [employer] and averages 12000 steps per day. She has cut back to 2 x 12 hour days. No catching, but she does occasionally feel some grinding.  PERTINENT HISTORY: Increasing R knee pain. Received injection 07/28/22. Degenerative tear of medial meniscus of right knee Acute worsening of her underlying knee pain. -Counseled on home exercise therapy and supportive care. -Refer to physical therapy. -Injection today. -Could consider further imaging or gel injection. PAIN:  Are you having pain? Yes: NPRS scale: 3/10 Pain location: R knee Pain description: aching, grinding Aggravating factors: working Relieving factors: rest , Voltarin gel, Lidocaine patches if she is working  PRECAUTIONS: None  WEIGHT BEARING RESTRICTIONS: No  FALLS:  Has patient fallen in last 6 months? No  LIVING ENVIRONMENT: Lives with: lives with their spouse Lives in: House/apartment Stairs: No issues on steps, wears knee brace. 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  NEXT MD VISIT:   OBJECTIVE:   DIAGNOSTIC FINDINGS: X ray in August showed no fracture or  arthritis per patient.   COGNITION: Overall cognitive status: Within functional limits for tasks assessed     SENSATION: Light touch: WFL  EDEMA:  Patient denies edema-wears ice pack on knee at night.  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  LOWER EXTREMITY ROM: WNL except B hip IR mildly diminished, increased B hip ER.   LOWER EXTREMITY MMT: All strength 5/5 unless noted.  MMT Right eval Left eval  Hip flexion 4-   Hip extension 4- 4-  Hip abduction 4- 4-  Hip adduction    Hip internal rotation    Hip external rotation    Knee flexion    Knee extension    Ankle dorsiflexion    Ankle plantarflexion    Ankle inversion    Ankle eversion      LOWER EXTREMITY SPECIAL TESTS:  Per Dr Raeford Razor- R meniscal tear.  GAIT: Distance walked: in clinic Assistive device utilized: None Level of assistance: Complete Independence Comments: Wearing knee brace on R, labored   TODAY'S TREATMENT:                                                                                                                              DATE:  08/17/22  Education HEP  PATIENT EDUCATION:  Education details: HE{P and POC Person educated: Patient Education method: Consulting civil engineer, Media planner, and Verbal cues Education comprehension: verbalized understanding, returned demonstration, and tactile cues required  HOME EXERCISE PROGRAM:  [id number]  ASSESSMENT:  CLINICAL IMPRESSION: Patient is a 56 y.o. who was seen today for physical therapy evaluation and treatment for R knee meniscus tear. 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 She demonstrates B hip weakness which may be contributing to there pain and instability. She will benefit from PT to establish and update HEP to facilitate improved hip strength, decreased knee pain.  OBJECTIVE IMPAIRMENTS: decreased activity tolerance, decreased coordination, decreased endurance, difficulty walking, decreased strength, impaired flexibility, postural dysfunction, and pain.   ACTIVITY LIMITATIONS: bending, squatting, stairs, and locomotion level  PARTICIPATION LIMITATIONS: driving, shopping, occupation, and yard work  PERSONAL FACTORS: Past/current experiences  are also affecting patient's functional outcome.   REHAB POTENTIAL: Good  CLINICAL DECISION MAKING: Stable/uncomplicated  EVALUATION COMPLEXITY: Low   GOALS: Goals reviewed with patient? Yes  SHORT TERM GOALS: Target date: 09/07/22 I with initial HEP Baseline: Goal status: INITIAL  LONG TERM GOALS: Target date: 11/09/22  I with final HEP Baseline:  Goal status: INITIAL  2.  Increase B hip strength to at least 4+/5 Baseline: 4- Goal status: INITIAL  3.  Patient will return to her full work day with R knee pain < 3/10 Baseline:  Goal status: INITIAL  4.  Patient will report increased confidence in her  R knee in all situations rather than guarding it when turning or lifting. Baseline:  Goal status: INITIAL  PLAN:  PT FREQUENCY: 1x/week  PT DURATION: 12 weeks  PLANNED INTERVENTIONS: Therapeutic exercises, Therapeutic activity, Neuromuscular re-education, Balance training, Gait training, Patient/Family education, Self Care, Joint mobilization, Stair training, Dry Needling, Electrical stimulation, Cryotherapy, Moist heat, Vasopneumatic device, Ionotophoresis '4mg'$ /ml Dexamethasone, and Manual therapy  PLAN FOR NEXT SESSION: Assess tolerance to HEP, update for strengthening.   Marcelina Morel, DPT 08/17/2022, 12:59 PM

## 2022-08-17 ENCOUNTER — Encounter: Payer: Self-pay | Admitting: Physical Therapy

## 2022-08-17 ENCOUNTER — Ambulatory Visit: Payer: 59 | Attending: Family Medicine | Admitting: Physical Therapy

## 2022-08-17 DIAGNOSIS — R262 Difficulty in walking, not elsewhere classified: Secondary | ICD-10-CM | POA: Insufficient documentation

## 2022-08-17 DIAGNOSIS — M23203 Derangement of unspecified medial meniscus due to old tear or injury, right knee: Secondary | ICD-10-CM | POA: Insufficient documentation

## 2022-08-17 DIAGNOSIS — G8929 Other chronic pain: Secondary | ICD-10-CM | POA: Diagnosis not present

## 2022-08-17 DIAGNOSIS — M6281 Muscle weakness (generalized): Secondary | ICD-10-CM | POA: Diagnosis not present

## 2022-08-17 DIAGNOSIS — R278 Other lack of coordination: Secondary | ICD-10-CM | POA: Diagnosis not present

## 2022-08-17 DIAGNOSIS — M25561 Pain in right knee: Secondary | ICD-10-CM | POA: Diagnosis not present

## 2022-08-22 DIAGNOSIS — M9906 Segmental and somatic dysfunction of lower extremity: Secondary | ICD-10-CM | POA: Diagnosis not present

## 2022-08-22 DIAGNOSIS — M1711 Unilateral primary osteoarthritis, right knee: Secondary | ICD-10-CM | POA: Diagnosis not present

## 2022-08-22 DIAGNOSIS — S83241A Other tear of medial meniscus, current injury, right knee, initial encounter: Secondary | ICD-10-CM | POA: Diagnosis not present

## 2022-08-22 DIAGNOSIS — M25561 Pain in right knee: Secondary | ICD-10-CM | POA: Diagnosis not present

## 2022-08-22 DIAGNOSIS — M7918 Myalgia, other site: Secondary | ICD-10-CM | POA: Diagnosis not present

## 2022-08-22 DIAGNOSIS — M25851 Other specified joint disorders, right hip: Secondary | ICD-10-CM | POA: Diagnosis not present

## 2022-08-22 DIAGNOSIS — M25671 Stiffness of right ankle, not elsewhere classified: Secondary | ICD-10-CM | POA: Diagnosis not present

## 2022-08-22 DIAGNOSIS — M25551 Pain in right hip: Secondary | ICD-10-CM | POA: Diagnosis not present

## 2022-08-23 ENCOUNTER — Ambulatory Visit: Payer: 59 | Attending: Family Medicine | Admitting: Physical Therapy

## 2022-08-23 ENCOUNTER — Encounter: Payer: Self-pay | Admitting: Physical Therapy

## 2022-08-23 DIAGNOSIS — M25561 Pain in right knee: Secondary | ICD-10-CM | POA: Insufficient documentation

## 2022-08-23 DIAGNOSIS — M6281 Muscle weakness (generalized): Secondary | ICD-10-CM | POA: Insufficient documentation

## 2022-08-23 DIAGNOSIS — R262 Difficulty in walking, not elsewhere classified: Secondary | ICD-10-CM | POA: Insufficient documentation

## 2022-08-23 DIAGNOSIS — G8929 Other chronic pain: Secondary | ICD-10-CM | POA: Diagnosis not present

## 2022-08-23 DIAGNOSIS — R278 Other lack of coordination: Secondary | ICD-10-CM | POA: Diagnosis not present

## 2022-08-23 NOTE — Therapy (Signed)
OUTPATIENT PHYSICAL THERAPY TREATMENT NOTE   Patient Name: Tina Scott MRN: [id number] DOB:04-24-1966, 56 y.o., female Today's Date: 08/23/2022  PCP:  Flossie Buffy, NP  REFERRING PROVIDER: Rosemarie Ax, MD   END OF SESSION:   PT End of Session - 08/23/22 0801     Visit Number 2    Date for PT Re-Evaluation 11/09/22    PT Start Time 0800    PT Stop Time 0845    PT Time Calculation (min) 45 min    Activity Tolerance Patient tolerated treatment well    Behavior During Therapy [facility] for tasks assessed/performed             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   Left Shoulder - anterior   Past Surgical History:  Procedure Laterality Date   CESAREAN SECTION     x2   COLONOSCOPY     HERNIA REPAIR     Hernia Revision also.    POLYPECTOMY     TUBAL LIGATION     Patient Active Problem List   Diagnosis Date Noted   GAD (generalized anxiety disorder) 07/06/2022   Peroneal tendinitis of left lower leg 06/14/2022   Degenerative tear of medial meniscus of right knee 05/15/2022   Pseudopolyposis of colon without complication, unspecified part of colon ([place]) 11/30/2021   Vitamin D insufficiency 06/16/2021   Hyperparathyroidism ([place]) 03/09/2021   Hypercalcemia 11/25/2020   Hx of basal cell carcinoma excision 11/24/2020   Hx of colonic polyp 05/25/2020   Pure hypercholesterolemia 05/25/2020   Carpal tunnel syndrome on right 01/02/2017   Essential hypertension 08/18/2016    REFERRING DIAG:  M23.203 (ICD-10-CM) - Degenerative tear of medial meniscus of right knee   THERAPY DIAG:  Difficulty in walking, not elsewhere classified  Other lack of coordination  Muscle weakness (generalized)  Chronic pain of right knee  Rationale for Evaluation and Treatment Rehabilitation  PERTINENT HISTORY: Increasing R knee pain. Received injection 07/28/22. Degenerative  tear of medial meniscus of right knee Acute worsening of her underlying knee pain. -Counseled on home exercise therapy and supportive care. -Refer to physical therapy. -Injection today. -Could consider further imaging or gel injection  PRECAUTIONS: None  SUBJECTIVE:                                                                                                                                                                                      SUBJECTIVE STATEMENT:  Doing ok overall, its more of an aggravation    PAIN:  Are you having pain? No 0  OBJECTIVE: (  objective measures completed at initial evaluation unless otherwise dated)  DIAGNOSTIC FINDINGS: X ray in August showed no fracture or arthritis per patient.     COGNITION: Overall cognitive status: Within functional limits for tasks assessed                         SENSATION: Light touch: WFL   EDEMA:  Patient denies edema-wears ice pack on knee at night.   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   LOWER EXTREMITY ROM: WNL except B hip IR mildly diminished, increased B hip ER.     LOWER EXTREMITY MMT: All strength 5/5 unless noted.   MMT Right eval Left eval  Hip flexion 4-    Hip extension 4- 4-  Hip abduction 4- 4-  Hip adduction      Hip internal rotation      Hip external rotation      Knee flexion      Knee extension      Ankle dorsiflexion      Ankle plantarflexion      Ankle inversion      Ankle eversion          LOWER EXTREMITY SPECIAL TESTS:  Per Dr Raeford Razor- R meniscal tear.   GAIT: Distance walked: in clinic Assistive device utilized: None Level of assistance: Complete Independence Comments: Wearing knee brace on R, labored     TODAY'S TREATMENT:                                                                                                                               DATE:  08/23/22 NuStep L5 x 6 min S2S 2x10  30lb resisted gait 4 way x 3 each 6in step ups x10 each Heel raises black bar 2x15 LAQ 3lb 2x10 Hamstring curls green 2x12  Hip add ball squeeze 2x15  Standing hip Abd & Ext 3lb 2x10  08/17/22  Education HEP   PATIENT EDUCATION:  Education details: HE{P and POC Person educated: Patient Education method: Consulting civil engineer, Media planner, and Verbal cues Education comprehension: verbalized understanding, returned demonstration, and tactile cues required   HOME EXERCISE PROGRAM:  [id number]   ASSESSMENT:   CLINICAL IMPRESSION: Pt enters session doing well. She has knee braces on both knees. All interventions completed well, cue at times needed for pacing. Pt really liked the resisted gait interventions. Postural cue required with standing hip interventions.   OBJECTIVE IMPAIRMENTS: decreased activity tolerance, decreased coordination, decreased endurance, difficulty walking, decreased strength, impaired flexibility, postural dysfunction, and pain.    ACTIVITY LIMITATIONS: bending, squatting, stairs, and locomotion level   PARTICIPATION LIMITATIONS: driving, shopping, occupation, and yard work   PERSONAL FACTORS: Past/current experiences are also affecting patient's functional outcome.  REHAB POTENTIAL: Good   CLINICAL DECISION MAKING: Stable/uncomplicated   EVALUATION COMPLEXITY: Low     GOALS: Goals reviewed with patient? Yes   SHORT TERM GOALS: Target date: 09/07/22 I with initial HEP Baseline: Goal status: Met 08/23/22   LONG TERM GOALS: Target date: 11/09/22   I with final HEP Baseline:  Goal status: INITIAL   2.  Increase B hip strength to at least 4+/5 Baseline: 4- Goal status: INITIAL   3.  Patient will return to her full work day with R knee pain < 3/10 Baseline:  Goal status: INITIAL   4.  Patient will report  increased confidence in her R knee in all situations rather than guarding it when turning or lifting. Baseline:  Goal status: INITIAL   PLAN:   PT FREQUENCY: 1x/week   PT DURATION: 12 weeks   PLANNED INTERVENTIONS: Therapeutic exercises, Therapeutic activity, Neuromuscular re-education, Balance training, Gait training, Patient/Family education, Self Care, Joint mobilization, Stair training, Dry Needling, Electrical stimulation, Cryotherapy, Moist heat, Vasopneumatic device, Ionotophoresis 60m/ml Dexamethasone, and Manual therapy   PLAN FOR NEXT SESSION: Assess tolerance to HEP, update for strengthening.       RScot Jun PTA 08/23/2022, 8:04 AM

## 2022-08-29 ENCOUNTER — Other Ambulatory Visit (HOSPITAL_BASED_OUTPATIENT_CLINIC_OR_DEPARTMENT_OTHER): Payer: Self-pay

## 2022-08-29 ENCOUNTER — Telehealth (HOSPITAL_COMMUNITY): Payer: Self-pay | Admitting: Psychiatry

## 2022-08-30 ENCOUNTER — Encounter: Payer: Self-pay | Admitting: Physical Therapy

## 2022-08-30 ENCOUNTER — Ambulatory Visit: Payer: 59 | Admitting: Physical Therapy

## 2022-08-30 DIAGNOSIS — R278 Other lack of coordination: Secondary | ICD-10-CM | POA: Diagnosis not present

## 2022-08-30 DIAGNOSIS — R262 Difficulty in walking, not elsewhere classified: Secondary | ICD-10-CM | POA: Diagnosis not present

## 2022-08-30 DIAGNOSIS — M6281 Muscle weakness (generalized): Secondary | ICD-10-CM | POA: Diagnosis not present

## 2022-08-30 DIAGNOSIS — G8929 Other chronic pain: Secondary | ICD-10-CM | POA: Diagnosis not present

## 2022-08-30 DIAGNOSIS — M25561 Pain in right knee: Secondary | ICD-10-CM | POA: Diagnosis not present

## 2022-08-30 NOTE — Therapy (Signed)
OUTPATIENT PHYSICAL THERAPY TREATMENT NOTE   Patient Name: Tina Scott MRN: [id number] DOB:04-25-1966, 56 y.o., female Today's Date: 08/30/2022  PCP:  Flossie Buffy, NP  REFERRING PROVIDER: Rosemarie Ax, MD   END OF SESSION:   PT End of Session - 08/30/22 0801     Visit Number 3    Date for PT Re-Evaluation 11/09/22    PT Start Time 0800    PT Stop Time 0845    PT Time Calculation (min) 45 min    Activity Tolerance Patient tolerated treatment well    Behavior During Therapy [facility] for tasks assessed/performed             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   Left Shoulder - anterior   Past Surgical History:  Procedure Laterality Date   CESAREAN SECTION     x2   COLONOSCOPY     HERNIA REPAIR     Hernia Revision also.    POLYPECTOMY     TUBAL LIGATION     Patient Active Problem List   Diagnosis Date Noted   GAD (generalized anxiety disorder) 07/06/2022   Peroneal tendinitis of left lower leg 06/14/2022   Degenerative tear of medial meniscus of right knee 05/15/2022   Pseudopolyposis of colon without complication, unspecified part of colon ([place]) 11/30/2021   Vitamin D insufficiency 06/16/2021   Hyperparathyroidism ([place]) 03/09/2021   Hypercalcemia 11/25/2020   Hx of basal cell carcinoma excision 11/24/2020   Hx of colonic polyp 05/25/2020   Pure hypercholesterolemia 05/25/2020   Carpal tunnel syndrome on right 01/02/2017   Essential hypertension 08/18/2016    REFERRING DIAG:  M23.203 (ICD-10-CM) - Degenerative tear of medial meniscus of right knee   THERAPY DIAG:  Difficulty in walking, not elsewhere classified  Other lack of coordination  Muscle weakness (generalized)  Chronic pain of right knee  Rationale for Evaluation and Treatment Rehabilitation  PERTINENT HISTORY: Increasing R knee pain. Received injection 07/28/22. Degenerative  tear of medial meniscus of right knee Acute worsening of her underlying knee pain. -Counseled on home exercise therapy and supportive care. -Refer to physical therapy. -Injection today. -Could consider further imaging or gel injection  PRECAUTIONS: None  SUBJECTIVE:                                                                                                                                                                                      SUBJECTIVE STATEMENT:  "Im ok"    PAIN:  Are you having pain? No 0  OBJECTIVE: (objective measures completed at initial evaluation  unless otherwise dated)  DIAGNOSTIC FINDINGS: X ray in August showed no fracture or arthritis per patient.     COGNITION: Overall cognitive status: Within functional limits for tasks assessed                         SENSATION: Light touch: WFL   EDEMA:  Patient denies edema-wears ice pack on knee at night.   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   LOWER EXTREMITY ROM: WNL except B hip IR mildly diminished, increased B hip ER.     LOWER EXTREMITY MMT: All strength 5/5 unless noted.   MMT Right eval Left eval  Hip flexion 4-    Hip extension 4- 4-  Hip abduction 4- 4-  Hip adduction      Hip internal rotation      Hip external rotation      Knee flexion      Knee extension      Ankle dorsiflexion      Ankle plantarflexion      Ankle inversion      Ankle eversion          LOWER EXTREMITY SPECIAL TESTS:  Per Dr Raeford Razor- R meniscal tear.   GAIT: Distance walked: in clinic Assistive device utilized: None Level of assistance: Complete Independence Comments: Wearing knee brace on R, labored     TODAY'S TREATMENT:                                                                                                                               DATE:  08/30/22 Bike L3.5 x 6 min  Leg press 30lb x12, 50lb x12 6in lateral step ups x12 eacn  4in eccentric step downs 2x12 Hamstring curls 35lb 2x10  Leg Ext 10lb 2x12, RLE Ext 5lb 2x5 Hip Ext, abd, add 5lb x 10 each Heel raises black bar 2x15   08/23/22 NuStep L5 x 6 min S2S 2x10  30lb resisted gait 4 way x 3 each 6in step ups x10 each Heel raises black bar 2x15 LAQ 3lb 2x10 Hamstring curls green 2x12  Hip add ball squeeze 2x15  Standing hip Abd & Ext 3lb 2x10  08/17/22  Education HEP   PATIENT EDUCATION:  Education details: HE{P and POC Person educated: Patient Education method: Consulting civil engineer, Media planner, and Verbal cues Education comprehension: verbalized understanding, returned demonstration, and tactile cues required   HOME EXERCISE PROGRAM:  [id number]   ASSESSMENT:   CLINICAL IMPRESSION: Pt enters  doing well reporting some improvement. She has a knee braces on R knee only today. All interventions completed well. Some quad burning reported with with leg extensions   OBJECTIVE IMPAIRMENTS: decreased activity tolerance, decreased coordination, decreased endurance, difficulty walking,  decreased strength, impaired flexibility, postural dysfunction, and pain.    ACTIVITY LIMITATIONS: bending, squatting, stairs, and locomotion level   PARTICIPATION LIMITATIONS: driving, shopping, occupation, and yard work   PERSONAL FACTORS: Past/current experiences are also affecting patient's functional outcome.    REHAB POTENTIAL: Good   CLINICAL DECISION MAKING: Stable/uncomplicated   EVALUATION COMPLEXITY: Low     GOALS: Goals reviewed with patient? Yes   SHORT TERM GOALS: Target date: 09/07/22 I with initial HEP Baseline: Goal status: Met 08/23/22   LONG TERM GOALS: Target date: 11/09/22   I with final HEP Baseline:  Goal status: INITIAL   2.  Increase B hip strength to at least 4+/5 Baseline:  4- Goal status: INITIAL   3.  Patient will return to her full work day with R knee pain < 3/10 Baseline:  Goal status: INITIAL   4.  Patient will report increased confidence in her R knee in all situations rather than guarding it when turning or lifting. Baseline:  Goal status: INITIAL   PLAN:   PT FREQUENCY: 1x/week   PT DURATION: 12 weeks   PLANNED INTERVENTIONS: Therapeutic exercises, Therapeutic activity, Neuromuscular re-education, Balance training, Gait training, Patient/Family education, Self Care, Joint mobilization, Stair training, Dry Needling, Electrical stimulation, Cryotherapy, Moist heat, Vasopneumatic device, Ionotophoresis 48m/ml Dexamethasone, and Manual therapy   PLAN FOR NEXT SESSION: Assess tolerance to HEP, update for strengthening.       RScot Jun PTA 08/30/2022, 8:01 AM

## 2022-09-04 ENCOUNTER — Telehealth: Payer: 59 | Admitting: Physician Assistant

## 2022-09-04 ENCOUNTER — Other Ambulatory Visit (HOSPITAL_BASED_OUTPATIENT_CLINIC_OR_DEPARTMENT_OTHER): Payer: Self-pay

## 2022-09-04 DIAGNOSIS — J069 Acute upper respiratory infection, unspecified: Secondary | ICD-10-CM | POA: Diagnosis not present

## 2022-09-04 MED ORDER — BENZONATATE 100 MG PO CAPS
100.0000 mg | ORAL_CAPSULE | Freq: Three times a day (TID) | ORAL | 0 refills | Status: DC | PRN
  Filled 2022-09-04: qty 30, 10d supply, fill #0

## 2022-09-04 MED ORDER — FLUTICASONE PROPIONATE 50 MCG/ACT NA SUSP
2.0000 | Freq: Every day | NASAL | 0 refills | Status: DC
  Filled 2022-09-04: qty 16, 30d supply, fill #0

## 2022-09-04 NOTE — Progress Notes (Signed)

## 2022-09-06 ENCOUNTER — Ambulatory Visit: Payer: 59 | Admitting: Physical Therapy

## 2022-09-06 ENCOUNTER — Encounter: Payer: Self-pay | Admitting: Physical Therapy

## 2022-09-06 DIAGNOSIS — R278 Other lack of coordination: Secondary | ICD-10-CM

## 2022-09-06 DIAGNOSIS — R262 Difficulty in walking, not elsewhere classified: Secondary | ICD-10-CM | POA: Diagnosis not present

## 2022-09-06 DIAGNOSIS — G8929 Other chronic pain: Secondary | ICD-10-CM | POA: Diagnosis not present

## 2022-09-06 DIAGNOSIS — M6281 Muscle weakness (generalized): Secondary | ICD-10-CM | POA: Diagnosis not present

## 2022-09-06 DIAGNOSIS — M25561 Pain in right knee: Secondary | ICD-10-CM | POA: Diagnosis not present

## 2022-09-06 NOTE — Therapy (Signed)
OUTPATIENT PHYSICAL THERAPY TREATMENT NOTE   Patient Name: Tina Scott MRN: [id number] DOB:06-Dec-1965, 56 y.o., female Today's Date: 09/06/2022  PCP:  Flossie Buffy, NP  REFERRING PROVIDER: Rosemarie Ax, MD   END OF SESSION:   PT End of Session - 09/06/22 0934     Visit Number 4    Date for PT Re-Evaluation 11/09/22    PT Start Time 0931    PT Stop Time 1010    PT Time Calculation (min) 39 min    Activity Tolerance Patient tolerated treatment well    Behavior During Therapy [facility] for tasks assessed/performed              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   Left Shoulder - anterior   Past Surgical History:  Procedure Laterality Date   CESAREAN SECTION     x2   COLONOSCOPY     HERNIA REPAIR     Hernia Revision also.    POLYPECTOMY     TUBAL LIGATION     Patient Active Problem List   Diagnosis Date Noted   GAD (generalized anxiety disorder) 07/06/2022   Peroneal tendinitis of left lower leg 06/14/2022   Degenerative tear of medial meniscus of right knee 05/15/2022   Pseudopolyposis of colon without complication, unspecified part of colon ([place]) 11/30/2021   Vitamin D insufficiency 06/16/2021   Hyperparathyroidism ([place]) 03/09/2021   Hypercalcemia 11/25/2020   Hx of basal cell carcinoma excision 11/24/2020   Hx of colonic polyp 05/25/2020   Pure hypercholesterolemia 05/25/2020   Carpal tunnel syndrome on right 01/02/2017   Essential hypertension 08/18/2016    REFERRING DIAG:  M23.203 (ICD-10-CM) - Degenerative tear of medial meniscus of right knee   THERAPY DIAG:  Difficulty in walking, not elsewhere classified  Other lack of coordination  Muscle weakness (generalized)  Chronic pain of right knee  Rationale for Evaluation and Treatment Rehabilitation  PERTINENT HISTORY: Increasing R knee pain. Received injection 07/28/22. Degenerative  tear of medial meniscus of right knee Acute worsening of her underlying knee pain. -Counseled on home exercise therapy and supportive care. -Refer to physical therapy. -Injection today. -Could consider further imaging or gel injection  PRECAUTIONS: None  SUBJECTIVE:                                                                                                                                                                                      SUBJECTIVE STATEMENT:  The knee gets sore with too much activity. It does calm down and no longer wakes her up at night.  PAIN:  Are you having pain? No 0  OBJECTIVE: (objective measures completed at initial evaluation unless otherwise dated)  DIAGNOSTIC FINDINGS: X ray in August showed no fracture or arthritis per patient.     COGNITION: Overall cognitive status: Within functional limits for tasks assessed                         SENSATION: Light touch: WFL   EDEMA:  Patient denies edema-wears ice pack on knee at night.   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   LOWER EXTREMITY ROM: WNL except B hip IR mildly diminished, increased B hip ER.     LOWER EXTREMITY MMT: All strength 5/5 unless noted.   MMT Right eval Left eval  Hip flexion 4-    Hip extension 4- 4-  Hip abduction 4- 4-  Hip adduction      Hip internal rotation      Hip external rotation      Knee flexion      Knee extension      Ankle dorsiflexion      Ankle plantarflexion      Ankle inversion      Ankle eversion          LOWER EXTREMITY SPECIAL TESTS:  Per Dr Raeford Razor- R meniscal tear.   GAIT: Distance walked: in clinic Assistive device utilized: None Level of assistance: Complete Independence Comments: Wearing knee brace on R,  labored     TODAY'S TREATMENT:                                                                                                                          DATE:  09/06/22 NuStep L5 x 6 minutes Supine strengthening-SLR- 5# x 10 and 4# x 10 Sidelying hip abd, 2 x 10  Sidelie-hip abd with forward and back slow swing of R leg, x 5 Prone TKE on toes. 2 x 10 reps Prone hip ext with knee straight , then with knee bent, 2 x 10 each Seated-rotate to R, slide hands out to side and back x 5 for stretch and trunk strengthening. Supine with legs over physioball in IR, bridge with hold, x 10 reps  B heel raises on bar x 20 reps Vaso to R knee 10 minutes, 34, mod pressure.  08/30/22 Bike L3.5 x 6 min  Leg press 30lb x12, 50lb x12 6in lateral step ups x12 eacn  4in eccentric step downs 2x12 Hamstring curls 35lb 2x10  Leg Ext 10lb 2x12, RLE Ext 5lb 2x5 Hip Ext, abd, add 5lb x 10 each Heel raises black bar 2x15   08/23/22 NuStep L5 x 6 min S2S 2x10  30lb resisted gait 4 way x 3 each 6in step ups x10  each Heel raises black bar 2x15 LAQ 3lb 2x10 Hamstring curls green 2x12  Hip add ball squeeze 2x15  Standing hip Abd & Ext 3lb 2x10  08/17/22  Education HEP   PATIENT EDUCATION:  Education details: HEP and POC Person educated: Patient Education method: Consulting civil engineer, Media planner, and Verbal cues Education comprehension: verbalized understanding, returned demonstration, and tactile cues required   HOME EXERCISE PROGRAM:  [id number]   ASSESSMENT:   CLINICAL IMPRESSION: Pt reports pain at the end of a shift at work due to 12000 steps. She was sore after last treatment. The pain resolves and does not wake her. Treatment emphasized strength and stability in hips, knees, and ankles as well as trunk stability, all while avoiding R knee flexion. She tolerated well. Finished with Vaso ro inflammation and pain relief.   OBJECTIVE IMPAIRMENTS: decreased activity tolerance, decreased coordination,  decreased endurance, difficulty walking, decreased strength, impaired flexibility, postural dysfunction, and pain.    ACTIVITY LIMITATIONS: bending, squatting, stairs, and locomotion level   PARTICIPATION LIMITATIONS: driving, shopping, occupation, and yard work   PERSONAL FACTORS: Past/current experiences are also affecting patient's functional outcome.    REHAB POTENTIAL: Good   CLINICAL DECISION MAKING: Stable/uncomplicated   EVALUATION COMPLEXITY: Low     GOALS: Goals reviewed with patient? Yes   SHORT TERM GOALS: Target date: 09/07/22 I with initial HEP Baseline: Goal status: Met 08/23/22   LONG TERM GOALS: Target date: 11/09/22   I with final HEP Baseline:  Goal status: INITIAL   2.  Increase B hip strength to at least 4+/5 Baseline: 4- Goal status: ongoing   3.  Patient will return to her full work day with R knee pain < 3/10 Baseline:  Goal status: ongoing   4.  Patient will report increased confidence in her R knee in all situations rather than guarding it when turning or lifting. Baseline:  Goal status: INITIAL   PLAN:   PT FREQUENCY: 1x/week   PT DURATION: 12 weeks   PLANNED INTERVENTIONS: Therapeutic exercises, Therapeutic activity, Neuromuscular re-education, Balance training, Gait training, Patient/Family education, Self Care, Joint mobilization, Stair training, Dry Needling, Electrical stimulation, Cryotherapy, Moist heat, Vasopneumatic device, Ionotophoresis 107m/ml Dexamethasone, and Manual therapy   PLAN FOR NEXT SESSION: Assess tolerance to HEP, update for strengthening.    SMarcelina Morel DPT 09/06/2022, 10:16 AM

## 2022-09-07 ENCOUNTER — Ambulatory Visit (INDEPENDENT_AMBULATORY_CARE_PROVIDER_SITE_OTHER): Payer: 59 | Admitting: Family Medicine

## 2022-09-07 ENCOUNTER — Encounter: Payer: Self-pay | Admitting: Family Medicine

## 2022-09-07 VITALS — BP 122/80 | Ht 64.0 in | Wt 220.0 lb

## 2022-09-07 DIAGNOSIS — M25851 Other specified joint disorders, right hip: Secondary | ICD-10-CM | POA: Diagnosis not present

## 2022-09-07 DIAGNOSIS — M23203 Derangement of unspecified medial meniscus due to old tear or injury, right knee: Secondary | ICD-10-CM

## 2022-09-07 DIAGNOSIS — M7918 Myalgia, other site: Secondary | ICD-10-CM | POA: Diagnosis not present

## 2022-09-07 DIAGNOSIS — M25551 Pain in right hip: Secondary | ICD-10-CM | POA: Diagnosis not present

## 2022-09-07 DIAGNOSIS — M25671 Stiffness of right ankle, not elsewhere classified: Secondary | ICD-10-CM | POA: Diagnosis not present

## 2022-09-07 DIAGNOSIS — S83241A Other tear of medial meniscus, current injury, right knee, initial encounter: Secondary | ICD-10-CM | POA: Diagnosis not present

## 2022-09-07 DIAGNOSIS — M25561 Pain in right knee: Secondary | ICD-10-CM | POA: Diagnosis not present

## 2022-09-07 DIAGNOSIS — M9906 Segmental and somatic dysfunction of lower extremity: Secondary | ICD-10-CM | POA: Diagnosis not present

## 2022-09-07 DIAGNOSIS — M1711 Unilateral primary osteoarthritis, right knee: Secondary | ICD-10-CM | POA: Diagnosis not present

## 2022-09-07 NOTE — Progress Notes (Signed)
  Tina Scott - 56 y.o. female MRN [id number]  Date of birth: 02/17/1966  SUBJECTIVE:  Including CC & ROS.  No chief complaint on file.   Tina Scott is a 56 y.o. female that is following up for her right knee pain.  She has been doing well with physical therapy.  Does not have pain through the course of the night.  She may feel pain with quick movement of her knee.    Review of Systems See HPI   HISTORY: Past Medical, Surgical, Social, and Family History Reviewed & Updated per EMR.   Pertinent Historical Findings include:  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   Left Shoulder - anterior    Past Surgical History:  Procedure Laterality Date   CESAREAN SECTION     x2   COLONOSCOPY     HERNIA REPAIR     Hernia Revision also.    POLYPECTOMY     TUBAL LIGATION       PHYSICAL EXAM:  VS: BP 122/80 (BP Location: Left Arm, Patient Position: Sitting)   Ht '5\' 4"'$  (1.626 m)   Wt 220 lb (99.8 kg)   BMI 37.76 kg/m  Physical Exam Gen: NAD, alert, cooperative with exam, well-appearing MSK:  Neurovascularly intact       ASSESSMENT & PLAN:   Degenerative tear of medial meniscus of right knee Doing well since the injection and with the help of physical therapy.  Her function has improved with little pain. -Counseled on home exercise therapy and supportive care. -Completed paperwork. -Could consider gel injection or custom orthotics.

## 2022-09-07 NOTE — Assessment & Plan Note (Signed)
Doing well since the injection and with the help of physical therapy.  Her function has improved with little pain. -Counseled on home exercise therapy and supportive care. -Completed paperwork. -Could consider gel injection or custom orthotics.

## 2022-09-14 ENCOUNTER — Encounter: Payer: Self-pay | Admitting: Physical Therapy

## 2022-09-14 ENCOUNTER — Ambulatory Visit: Payer: 59 | Admitting: Physical Therapy

## 2022-09-14 DIAGNOSIS — M6281 Muscle weakness (generalized): Secondary | ICD-10-CM

## 2022-09-14 DIAGNOSIS — G8929 Other chronic pain: Secondary | ICD-10-CM | POA: Diagnosis not present

## 2022-09-14 DIAGNOSIS — R262 Difficulty in walking, not elsewhere classified: Secondary | ICD-10-CM

## 2022-09-14 DIAGNOSIS — M25561 Pain in right knee: Secondary | ICD-10-CM | POA: Diagnosis not present

## 2022-09-14 DIAGNOSIS — R278 Other lack of coordination: Secondary | ICD-10-CM | POA: Diagnosis not present

## 2022-09-14 NOTE — Therapy (Signed)
OUTPATIENT PHYSICAL THERAPY TREATMENT NOTE   Patient Name: Tina Scott MRN: [id number] DOB:Nov 01, 1965, 56 y.o., female Today's Date: 09/14/2022  PCP:  Flossie Buffy, NP  REFERRING PROVIDER: Rosemarie Ax, MD   END OF SESSION:   PT End of Session - 09/14/22 0844     Visit Number 5    Date for PT Re-Evaluation 11/09/22    PT Start Time 0845    PT Stop Time 0930    PT Time Calculation (min) 45 min    Activity Tolerance Patient tolerated treatment well    Behavior During Therapy [facility] for tasks assessed/performed              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   Left Shoulder - anterior   Past Surgical History:  Procedure Laterality Date   CESAREAN SECTION     x2   COLONOSCOPY     HERNIA REPAIR     Hernia Revision also.    POLYPECTOMY     TUBAL LIGATION     Patient Active Problem List   Diagnosis Date Noted   GAD (generalized anxiety disorder) 07/06/2022   Peroneal tendinitis of left lower leg 06/14/2022   Degenerative tear of medial meniscus of right knee 05/15/2022   Pseudopolyposis of colon without complication, unspecified part of colon ([place]) 11/30/2021   Vitamin D insufficiency 06/16/2021   Hyperparathyroidism ([place]) 03/09/2021   Hypercalcemia 11/25/2020   Hx of basal cell carcinoma excision 11/24/2020   Hx of colonic polyp 05/25/2020   Pure hypercholesterolemia 05/25/2020   Carpal tunnel syndrome on right 01/02/2017   Essential hypertension 08/18/2016    REFERRING DIAG:  M23.203 (ICD-10-CM) - Degenerative tear of medial meniscus of right knee   THERAPY DIAG:  Difficulty in walking, not elsewhere classified  Muscle weakness (generalized)  Chronic pain of right knee  Rationale for Evaluation and Treatment Rehabilitation  PERTINENT HISTORY: Increasing R knee pain. Received injection 07/28/22. Degenerative tear of medial meniscus of  right knee Acute worsening of her underlying knee pain. -Counseled on home exercise therapy and supportive care. -Refer to physical therapy. -Injection today. -Could consider further imaging or gel injection  PRECAUTIONS: None  SUBJECTIVE:                                                                                                                                                                                      SUBJECTIVE STATEMENT:   "Good" MD wants her to workout without brace   PAIN:  Are you having pain? No 0  OBJECTIVE: (objective measures completed at  initial evaluation unless otherwise dated)  DIAGNOSTIC FINDINGS: X ray in August showed no fracture or arthritis per patient.     COGNITION: Overall cognitive status: Within functional limits for tasks assessed                         SENSATION: Light touch: WFL   EDEMA:  Patient denies edema-wears ice pack on knee at night.   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   LOWER EXTREMITY ROM: WNL except B hip IR mildly diminished, increased B hip ER.     LOWER EXTREMITY MMT: All strength 5/5 unless noted.   MMT Right eval Left eval  Hip flexion 4-    Hip extension 4- 4-  Hip abduction 4- 4-  Hip adduction      Hip internal rotation      Hip external rotation      Knee flexion      Knee extension      Ankle dorsiflexion      Ankle plantarflexion      Ankle inversion      Ankle eversion          LOWER EXTREMITY SPECIAL TESTS:  Per Dr Raeford Razor- R meniscal tear.   GAIT: Distance walked: in clinic Assistive device utilized: None Level of assistance: Complete Independence Comments: Wearing knee brace on R, labored     TODAY'S TREATMENT:                                                                                                                           DATE:  09/14/22 Bike L4 x6 min  Leg press 30lb 3x12  Hamstring curls 35lb 2x12 Leg Ext 10lb 2x10 Lateral 6in step ups x10 Heel raises 2x15 Step ups 6in x10 each  S2S with yellow ball 2x10   09/06/22 NuStep L5 x 6 minutes Supine strengthening-SLR- 5# x 10 and 4# x 10 Sidelying hip abd, 2 x 10  Sidelie-hip abd with forward and back slow swing of R leg, x 5 Prone TKE on toes. 2 x 10 reps Prone hip ext with knee straight , then with knee bent, 2 x 10 each Seated-rotate to R, slide hands out to side and back x 5 for stretch and trunk strengthening. Supine with legs over physioball in IR, bridge with hold, x 10 reps  B heel raises on bar x 20 reps Vaso to R knee 10 minutes, 34, mod pressure.  08/30/22 Bike L3.5 x 6 min  Leg press 30lb x12, 50lb x12 6in lateral step ups x12 eacn  4in eccentric step downs 2x12 Hamstring curls 35lb 2x10  Leg Ext 10lb 2x12, RLE Ext 5lb 2x5 Hip Ext, abd, add 5lb x 10 each Heel raises  black bar 2x15   08/23/22 NuStep L5 x 6 min S2S 2x10  30lb resisted gait 4 way x 3 each 6in step ups x10 each Heel raises black bar 2x15 LAQ 3lb 2x10 Hamstring curls green 2x12  Hip add ball squeeze 2x15  Standing hip Abd & Ext 3lb 2x10  08/17/22  Education HEP   PATIENT EDUCATION:  Education details: HEP and POC Person educated: Patient Education method: Consulting civil engineer, Media planner, and Verbal cues Education comprehension: verbalized understanding, returned demonstration, and tactile cues required   HOME EXERCISE PROGRAM:  [id number]   ASSESSMENT:   CLINICAL IMPRESSION: Session completed without knee brace for the first time. Treatment emphasized strength and stability of LE. intensity decrease due to no having brace for the first time.  She tolerated well.   OBJECTIVE IMPAIRMENTS: decreased activity tolerance, decreased coordination, decreased endurance, difficulty walking, decreased  strength, impaired flexibility, postural dysfunction, and pain.    ACTIVITY LIMITATIONS: bending, squatting, stairs, and locomotion level   PARTICIPATION LIMITATIONS: driving, shopping, occupation, and yard work   PERSONAL FACTORS: Past/current experiences are also affecting patient's functional outcome.    REHAB POTENTIAL: Good   CLINICAL DECISION MAKING: Stable/uncomplicated   EVALUATION COMPLEXITY: Low     GOALS: Goals reviewed with patient? Yes   SHORT TERM GOALS: Target date: 09/07/22 I with initial HEP Baseline: Goal status: Met 08/23/22   LONG TERM GOALS: Target date: 11/09/22   I with final HEP Baseline:  Goal status: INITIAL   2.  Increase B hip strength to at least 4+/5 Baseline: 4- Goal status: ongoing   3.  Patient will return to her full work day with R knee pain < 3/10 Baseline:  Goal status: ongoing   4.  Patient will report increased confidence in her R knee in all situations rather than guarding it when turning or lifting. Baseline:  Goal status: INITIAL   PLAN:   PT FREQUENCY: 1x/week   PT DURATION: 12 weeks   PLANNED INTERVENTIONS: Therapeutic exercises, Therapeutic activity, Neuromuscular re-education, Balance training, Gait training, Patient/Family education, Self Care, Joint mobilization, Stair training, Dry Needling, Electrical stimulation, Cryotherapy, Moist heat, Vasopneumatic device, Ionotophoresis 66m/ml Dexamethasone, and Manual therapy   PLAN FOR NEXT SESSION: Assess tolerance to HEP, update for strengthening.    SMarcelina Morel DPT 09/14/2022, 8:44 AM

## 2022-09-15 DIAGNOSIS — M25561 Pain in right knee: Secondary | ICD-10-CM | POA: Diagnosis not present

## 2022-09-15 DIAGNOSIS — M25671 Stiffness of right ankle, not elsewhere classified: Secondary | ICD-10-CM | POA: Diagnosis not present

## 2022-09-15 DIAGNOSIS — M9906 Segmental and somatic dysfunction of lower extremity: Secondary | ICD-10-CM | POA: Diagnosis not present

## 2022-09-15 DIAGNOSIS — M1711 Unilateral primary osteoarthritis, right knee: Secondary | ICD-10-CM | POA: Diagnosis not present

## 2022-09-15 DIAGNOSIS — M25551 Pain in right hip: Secondary | ICD-10-CM | POA: Diagnosis not present

## 2022-09-15 DIAGNOSIS — M7918 Myalgia, other site: Secondary | ICD-10-CM | POA: Diagnosis not present

## 2022-09-15 DIAGNOSIS — M25851 Other specified joint disorders, right hip: Secondary | ICD-10-CM | POA: Diagnosis not present

## 2022-09-15 DIAGNOSIS — S83241A Other tear of medial meniscus, current injury, right knee, initial encounter: Secondary | ICD-10-CM | POA: Diagnosis not present

## 2022-09-20 ENCOUNTER — Ambulatory Visit: Payer: 59 | Attending: Family Medicine | Admitting: Physical Therapy

## 2022-09-20 ENCOUNTER — Encounter: Payer: Self-pay | Admitting: Physical Therapy

## 2022-09-20 DIAGNOSIS — G8929 Other chronic pain: Secondary | ICD-10-CM | POA: Diagnosis not present

## 2022-09-20 DIAGNOSIS — R278 Other lack of coordination: Secondary | ICD-10-CM | POA: Diagnosis not present

## 2022-09-20 DIAGNOSIS — R262 Difficulty in walking, not elsewhere classified: Secondary | ICD-10-CM | POA: Diagnosis not present

## 2022-09-20 DIAGNOSIS — M25561 Pain in right knee: Secondary | ICD-10-CM | POA: Diagnosis not present

## 2022-09-20 DIAGNOSIS — M6281 Muscle weakness (generalized): Secondary | ICD-10-CM

## 2022-09-20 NOTE — Therapy (Signed)
OUTPATIENT PHYSICAL THERAPY TREATMENT NOTE   Patient Name: Tina Scott MRN: [id number] DOB:May 24, 1966, 57 y.o., female Today's Date: 09/20/2022  PCP:  Flossie Buffy, NP  REFERRING PROVIDER: Rosemarie Ax, MD   END OF SESSION:   PT End of Session - 09/20/22 0804     Visit Number 6    Date for PT Re-Evaluation 11/09/22    PT Start Time 0800    PT Stop Time 0845    PT Time Calculation (min) 45 min    Activity Tolerance Patient tolerated treatment well    Behavior During Therapy [facility] for tasks assessed/performed              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   Left Shoulder - anterior   Past Surgical History:  Procedure Laterality Date   CESAREAN SECTION     x2   COLONOSCOPY     HERNIA REPAIR     Hernia Revision also.    POLYPECTOMY     TUBAL LIGATION     Patient Active Problem List   Diagnosis Date Noted   GAD (generalized anxiety disorder) 07/06/2022   Peroneal tendinitis of left lower leg 06/14/2022   Degenerative tear of medial meniscus of right knee 05/15/2022   Pseudopolyposis of colon without complication, unspecified part of colon ([place]) 11/30/2021   Vitamin D insufficiency 06/16/2021   Hyperparathyroidism ([place]) 03/09/2021   Hypercalcemia 11/25/2020   Hx of basal cell carcinoma excision 11/24/2020   Hx of colonic polyp 05/25/2020   Pure hypercholesterolemia 05/25/2020   Carpal tunnel syndrome on right 01/02/2017   Essential hypertension 08/18/2016    REFERRING DIAG:  M23.203 (ICD-10-CM) - Degenerative tear of medial meniscus of right knee   THERAPY DIAG:  Difficulty in walking, not elsewhere classified  Muscle weakness (generalized)  Chronic pain of right knee  Other lack of coordination  Rationale for Evaluation and Treatment Rehabilitation  PERTINENT HISTORY: Increasing R knee pain. Received injection 07/28/22. Degenerative  tear of medial meniscus of right knee Acute worsening of her underlying knee pain. -Counseled on home exercise therapy and supportive care. -Refer to physical therapy. -Injection today. -Could consider further imaging or gel injection  PRECAUTIONS: None  SUBJECTIVE:                                                                                                                                                                                      SUBJECTIVE STATEMENT:   "Fine"   PAIN:  Are you having pain? No 0  OBJECTIVE: (objective measures completed at initial evaluation  unless otherwise dated)  DIAGNOSTIC FINDINGS: X ray in August showed no fracture or arthritis per patient.     COGNITION: Overall cognitive status: Within functional limits for tasks assessed                         SENSATION: Light touch: WFL   EDEMA:  Patient denies edema-wears ice pack on knee at night.   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   LOWER EXTREMITY ROM: WNL except B hip IR mildly diminished, increased B hip ER.     LOWER EXTREMITY MMT: All strength 5/5 unless noted.   MMT Right eval Left eval  Hip flexion 4-    Hip extension 4- 4-  Hip abduction 4- 4-  Hip adduction      Hip internal rotation      Hip external rotation      Knee flexion      Knee extension      Ankle dorsiflexion      Ankle plantarflexion      Ankle inversion      Ankle eversion          LOWER EXTREMITY SPECIAL TESTS:  Per Dr Raeford Razor- R meniscal tear.   GAIT: Distance walked: in clinic Assistive device utilized: None Level of assistance: Complete Independence Comments: Wearing knee brace on R, labored     TODAY'S TREATMENT:                                                                                                                           DATE:  09/20/21 Bike L4 x 6 min  Step ups airex on 6 in box x10 each Resisted gait 30lb w/ 6 in step up x 8 40lb resisted side step x8 each  4in eccentric step downs 2x12 Leg press 60lb 2x12 S2S 10lb 2x10 Hamstring curls 35lb 2x12 Leg Ext 10lb 2x10, RLE 5lb x5  09/14/22 Bike L4 x6 min  Leg press 30lb 3x12  Hamstring curls 35lb 2x12 Leg Ext 10lb 2x10 Lateral 6in step ups x10 Heel raises 2x15 Step ups 6in x10 each  S2S with yellow ball 2x10   09/06/22 NuStep L5 x 6 minutes Supine strengthening-SLR- 5# x 10 and 4# x 10 Sidelying hip abd, 2 x 10  Sidelie-hip abd with forward and back slow swing of R leg, x 5 Prone TKE on toes. 2 x 10 reps Prone hip ext with knee straight , then with knee bent, 2 x 10 each Seated-rotate to R, slide hands out to side and back x 5 for stretch and trunk strengthening. Supine with legs over physioball in IR, bridge with hold, x 10 reps  B heel raises on bar x 20 reps Vaso to R  knee 10 minutes, 34, mod pressure.  08/30/22 Bike L3.5 x 6 min  Leg press 30lb x12, 50lb x12 6in lateral step ups x12 eacn  4in eccentric step downs 2x12 Hamstring curls 35lb 2x10  Leg Ext 10lb 2x12, RLE Ext 5lb 2x5 Hip Ext, abd, add 5lb x 10 each Heel raises black bar 2x15   08/23/22 NuStep L5 x 6 min S2S 2x10  30lb resisted gait 4 way x 3 each 6in step ups x10 each Heel raises black bar 2x15 LAQ 3lb 2x10 Hamstring curls green 2x12  Hip add ball squeeze 2x15  Standing hip Abd & Ext 3lb 2x10  08/17/22  Education HEP   PATIENT EDUCATION:  Education details: HEP and POC Person educated: Patient Education method: Consulting civil engineer, Media planner, and Verbal cues Education comprehension: verbalized understanding, returned demonstration, and tactile cues required   HOME EXERCISE PROGRAM:  [id number]   ASSESSMENT:   CLINICAL IMPRESSION: Session completed without knee brace again. Treatment emphasized  strength and stability of LE. Added resisted step up to session, pt had some instability when completing. Some weakness displayed with eccentric step downs and leg extensions.    OBJECTIVE IMPAIRMENTS: decreased activity tolerance, decreased coordination, decreased endurance, difficulty walking, decreased strength, impaired flexibility, postural dysfunction, and pain.    ACTIVITY LIMITATIONS: bending, squatting, stairs, and locomotion level   PARTICIPATION LIMITATIONS: driving, shopping, occupation, and yard work   PERSONAL FACTORS: Past/current experiences are also affecting patient's functional outcome.    REHAB POTENTIAL: Good   CLINICAL DECISION MAKING: Stable/uncomplicated   EVALUATION COMPLEXITY: Low     GOALS: Goals reviewed with patient? Yes   SHORT TERM GOALS: Target date: 09/07/22 I with initial HEP Baseline: Goal status: Met 08/23/22   LONG TERM GOALS: Target date: 11/09/22   I with final HEP Baseline:  Goal status: INITIAL   2.  Increase B hip strength to at least 4+/5 Baseline: 4- Goal status: ongoing   3.  Patient will return to her full work day with R knee pain < 3/10 Baseline:  Goal status: ongoing   4.  Patient will report increased confidence in her R knee in all situations rather than guarding it when turning or lifting. Baseline:  Goal status: INITIAL   PLAN:   PT FREQUENCY: 1x/week   PT DURATION: 12 weeks   PLANNED INTERVENTIONS: Therapeutic exercises, Therapeutic activity, Neuromuscular re-education, Balance training, Gait training, Patient/Family education, Self Care, Joint mobilization, Stair training, Dry Needling, Electrical stimulation, Cryotherapy, Moist heat, Vasopneumatic device, Ionotophoresis 19m/ml Dexamethasone, and Manual therapy   PLAN FOR NEXT SESSION: Assess tolerance to HEP, update for strengthening.    SMarcelina Morel DPT 09/20/2022, 8:04 AM

## 2022-09-21 ENCOUNTER — Ambulatory Visit: Payer: 59 | Admitting: Physician Assistant

## 2022-09-27 ENCOUNTER — Ambulatory Visit: Payer: 59 | Admitting: Physical Therapy

## 2022-09-27 ENCOUNTER — Encounter: Payer: Self-pay | Admitting: Physical Therapy

## 2022-09-27 DIAGNOSIS — R262 Difficulty in walking, not elsewhere classified: Secondary | ICD-10-CM | POA: Diagnosis not present

## 2022-09-27 DIAGNOSIS — R278 Other lack of coordination: Secondary | ICD-10-CM | POA: Diagnosis not present

## 2022-09-27 DIAGNOSIS — G8929 Other chronic pain: Secondary | ICD-10-CM | POA: Diagnosis not present

## 2022-09-27 DIAGNOSIS — M6281 Muscle weakness (generalized): Secondary | ICD-10-CM

## 2022-09-27 DIAGNOSIS — M25561 Pain in right knee: Secondary | ICD-10-CM | POA: Diagnosis not present

## 2022-09-27 NOTE — Therapy (Signed)
OUTPATIENT PHYSICAL THERAPY TREATMENT NOTE   Patient Name: Tina Scott MRN: [id number] DOB:03-Oct-1965, 57 y.o., female Today's Date: 09/27/2022  PCP:  Flossie Buffy, NP  REFERRING PROVIDER: Rosemarie Ax, MD   END OF SESSION:   PT End of Session - 09/27/22 1301     Visit Number 7    Date for PT Re-Evaluation 11/09/22    PT Start Time 1300    PT Stop Time 1345    PT Time Calculation (min) 45 min    Activity Tolerance Patient tolerated treatment well    Behavior During Therapy [facility] for tasks assessed/performed              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   Left Shoulder - anterior   Past Surgical History:  Procedure Laterality Date   CESAREAN SECTION     x2   COLONOSCOPY     HERNIA REPAIR     Hernia Revision also.    POLYPECTOMY     TUBAL LIGATION     Patient Active Problem List   Diagnosis Date Noted   GAD (generalized anxiety disorder) 07/06/2022   Peroneal tendinitis of left lower leg 06/14/2022   Degenerative tear of medial meniscus of right knee 05/15/2022   Pseudopolyposis of colon without complication, unspecified part of colon ([place]) 11/30/2021   Vitamin D insufficiency 06/16/2021   Hyperparathyroidism ([place]) 03/09/2021   Hypercalcemia 11/25/2020   Hx of basal cell carcinoma excision 11/24/2020   Hx of colonic polyp 05/25/2020   Pure hypercholesterolemia 05/25/2020   Carpal tunnel syndrome on right 01/02/2017   Essential hypertension 08/18/2016    REFERRING DIAG:  M23.203 (ICD-10-CM) - Degenerative tear of medial meniscus of right knee   THERAPY DIAG:  Difficulty in walking, not elsewhere classified  Muscle weakness (generalized)  Chronic pain of right knee  Other lack of coordination  Rationale for Evaluation and Treatment Rehabilitation  PERTINENT HISTORY: Increasing R knee pain. Received injection 07/28/22. Degenerative  tear of medial meniscus of right knee Acute worsening of her underlying knee pain. -Counseled on home exercise therapy and supportive care. -Refer to physical therapy. -Injection today. -Could consider further imaging or gel injection  PRECAUTIONS: None  SUBJECTIVE:                                                                                                                                                                                      SUBJECTIVE STATEMENT:   "Fine" Does have hip pain with prolong sitting   PAIN:  Are you having pain? No 0  OBJECTIVE: (objective measures completed at initial evaluation unless otherwise dated)  DIAGNOSTIC FINDINGS: X ray in August showed no fracture or arthritis per patient.     COGNITION: Overall cognitive status: Within functional limits for tasks assessed                         SENSATION: Light touch: WFL   EDEMA:  Patient denies edema-wears ice pack on knee at night.   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   LOWER EXTREMITY ROM: WNL except B hip IR mildly diminished, increased B hip ER.     LOWER EXTREMITY MMT: All strength 5/5 unless noted.   MMT Right eval Left eval  Hip flexion 4-    Hip extension 4- 4-  Hip abduction 4- 4-  Hip adduction      Hip internal rotation      Hip external rotation      Knee flexion      Knee extension      Ankle dorsiflexion      Ankle plantarflexion      Ankle inversion      Ankle eversion          LOWER EXTREMITY SPECIAL TESTS:  Per Dr Raeford Razor- R meniscal tear.   GAIT: Distance walked: in clinic Assistive device utilized: None Level of assistance: Complete Independence Comments: Wearing knee brace on R, labored     TODAY'S TREATMENT:                                                                                                                           DATE:  09/27/22 Elliptical L2 x 5 min Hamstring curls 35lb 3x12  Leg Ext 10lb 2x12, RLE 5lb x5 4in eccentric step downs 2x12 Leg press 60lb 3x15 S2S 5lb 2x12 Side step over foam roll on airex Heel raises.   09/20/21 Bike L4 x 6 min  Step ups airex on 6 in box x10 each Resisted gait 30lb w/ 6 in step up x 8 40lb resisted side step x8 each  4in eccentric step downs 2x12 Leg press 60lb 2x12 S2S 10lb 2x10 Hamstring curls 35lb 2x12 Leg Ext 10lb 2x10, RLE 5lb x5  09/14/22 Bike L4 x6 min  Leg press 30lb 3x12  Hamstring curls 35lb 2x12 Leg Ext 10lb 2x10 Lateral 6in step ups x10 Heel raises 2x15 Step ups 6in x10 each  S2S with yellow ball 2x10   09/06/22 NuStep L5 x 6 minutes Supine strengthening-SLR- 5# x 10 and 4# x 10 Sidelying hip abd, 2 x 10  Sidelie-hip abd with forward and back slow swing of R leg, x 5 Prone TKE on toes. 2 x 10 reps Prone hip ext with knee straight , then with  knee bent, 2 x 10 each Seated-rotate to R, slide hands out to side and back x 5 for stretch and trunk strengthening. Supine with legs over physioball in IR, bridge with hold, x 10 reps  B heel raises on bar x 20 reps Vaso to R knee 10 minutes, 34, mod pressure.  08/30/22 Bike L3.5 x 6 min  Leg press 30lb x12, 50lb x12 6in lateral step ups x12 eacn  4in eccentric step downs 2x12 Hamstring curls 35lb 2x10  Leg Ext 10lb 2x12, RLE Ext 5lb 2x5 Hip Ext, abd, add 5lb x 10 each Heel raises black bar 2x15     PATIENT EDUCATION:  Education details: HEP and POC Person educated: Patient Education method: Consulting civil engineer, Demonstration, and Verbal cues Education comprehension: verbalized understanding, returned demonstration, and tactile cues required   HOME EXERCISE PROGRAM:  [id number]   ASSESSMENT:   CLINICAL IMPRESSION: Session completed without knee brace again. Treatment emphasized strength and  stability of LE. Increase sets tolerated with all machine level interventions. Some quad and glute burning reported on elliptical. No reports of pain during session.    OBJECTIVE IMPAIRMENTS: decreased activity tolerance, decreased coordination, decreased endurance, difficulty walking, decreased strength, impaired flexibility, postural dysfunction, and pain.    ACTIVITY LIMITATIONS: bending, squatting, stairs, and locomotion level   PARTICIPATION LIMITATIONS: driving, shopping, occupation, and yard work   PERSONAL FACTORS: Past/current experiences are also affecting patient's functional outcome.    REHAB POTENTIAL: Good   CLINICAL DECISION MAKING: Stable/uncomplicated   EVALUATION COMPLEXITY: Low     GOALS: Goals reviewed with patient? Yes   SHORT TERM GOALS: Target date: 09/07/22 I with initial HEP Baseline: Goal status: Met 08/23/22   LONG TERM GOALS: Target date: 11/09/22   I with final HEP Baseline:  Goal status: INITIAL   2.  Increase B hip strength to at least 4+/5 Baseline: 4- Goal status: ongoing   3.  Patient will return to her full work day with R knee pain < 3/10 Baseline:  Goal status: ongoing   4.  Patient will report increased confidence in her R knee in all situations rather than guarding it when turning or lifting. Baseline:  Goal status: INITIAL   PLAN:   PT FREQUENCY: 1x/week   PT DURATION: 12 weeks   PLANNED INTERVENTIONS: Therapeutic exercises, Therapeutic activity, Neuromuscular re-education, Balance training, Gait training, Patient/Family education, Self Care, Joint mobilization, Stair training, Dry Needling, Electrical stimulation, Cryotherapy, Moist heat, Vasopneumatic device, Ionotophoresis '4mg'$ /ml Dexamethasone, and Manual therapy   PLAN FOR NEXT SESSION: Assess tolerance to HEP, update for strengthening.    Marcelina Morel, DPT 09/27/2022, 1:01 PM

## 2022-10-09 NOTE — Therapy (Signed)
OUTPATIENT PHYSICAL THERAPY TREATMENT NOTE   Patient Name: Tina Scott MRN: [id number] DOB:[birth year], 57 y.o., female Today's Date: 10/11/2022  PCP:  Flossie Buffy, NP  REFERRING PROVIDER: Rosemarie Ax, MD   END OF SESSION:   PT End of Session - 10/11/22 0759     Visit Number 8    Date for PT Re-Evaluation 11/09/22    PT Start Time 0758    PT Stop Time 0840    PT Time Calculation (min) 42 min    Activity Tolerance Patient tolerated treatment well    Behavior During Therapy [facility] for tasks assessed/performed               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   Left Shoulder - anterior   Past Surgical History:  Procedure Laterality Date   CESAREAN SECTION     x2   COLONOSCOPY     HERNIA REPAIR     Hernia Revision also.    POLYPECTOMY     TUBAL LIGATION     Patient Active Problem List   Diagnosis Date Noted   GAD (generalized anxiety disorder) 07/06/2022   Peroneal tendinitis of left lower leg 06/14/2022   Degenerative tear of medial meniscus of right knee 05/15/2022   Pseudopolyposis of colon without complication, unspecified part of colon ([place]) 11/30/2021   Vitamin D insufficiency 06/16/2021   Hyperparathyroidism ([place]) 03/09/2021   Hypercalcemia 11/25/2020   Hx of basal cell carcinoma excision 11/24/2020   Hx of colonic polyp 05/25/2020   Pure hypercholesterolemia 05/25/2020   Carpal tunnel syndrome on right 01/02/2017   Essential hypertension 08/18/2016    REFERRING DIAG:  M23.203 (ICD-10-CM) - Degenerative tear of medial meniscus of right knee   THERAPY DIAG:  Chronic pain of right knee  Muscle weakness (generalized)  Other lack of coordination  Difficulty in walking, not elsewhere classified  Rationale for Evaluation and Treatment Rehabilitation  PERTINENT HISTORY: Increasing R knee pain. Received injection 07/28/22. Degenerative  tear of medial meniscus of right knee Acute worsening of her underlying knee pain. -Counseled on home exercise therapy and supportive care. -Refer to physical therapy. -Injection today. -Could consider further imaging or gel injection  PRECAUTIONS: None  SUBJECTIVE:                                                                                                                                                                                      SUBJECTIVE STATEMENT:   I am fine, I was doing some exercises last week when I couldn't come and I still feel  it.    PAIN:  Are you having pain? Yes: NPRS scale: 3/10   OBJECTIVE: (objective measures completed at initial evaluation unless otherwise dated)  DIAGNOSTIC FINDINGS: X ray in August showed no fracture or arthritis per patient.     COGNITION: Overall cognitive status: Within functional limits for tasks assessed                         SENSATION: Light touch: WFL   EDEMA:  Patient denies edema-wears ice pack on knee at night.   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   LOWER EXTREMITY ROM: WNL except B hip IR mildly diminished, increased B hip ER.     LOWER EXTREMITY MMT: All strength 5/5 unless noted.   MMT Right eval Left eval  Hip flexion 4-    Hip extension 4- 4-  Hip abduction 4- 4-  Hip adduction      Hip internal rotation      Hip external rotation      Knee flexion      Knee extension      Ankle dorsiflexion      Ankle plantarflexion      Ankle inversion      Ankle eversion          LOWER EXTREMITY SPECIAL TESTS:  Per Dr Raeford Razor- R meniscal tear.   GAIT: Distance walked: in clinic Assistive device utilized: None Level of assistance: Complete Independence Comments: Wearing knee brace  on R, labored     TODAY'S TREATMENT:                                                                                                                          DATE:  10/11/22 Bike L4 x36mns  Leg ext 15# 2x10 HS curls 35# 2x10  Leg press 60# 2x10 STS with yellow ball chest press 2x10  Step ups 6" and then lateral step ups  Calf stretch 30s  Calf raises 2x12  Resisted gait over obstacles x5 each side 40#  SL catch with red ball    09/27/22 Elliptical L2 x 5 min Hamstring curls 35lb 3x12  Leg Ext 10lb 2x12, RLE 5lb x5 4in eccentric step downs 2x12 Leg press 60lb 3x15 S2S 5lb 2x12 Side step over foam roll on airex Heel raises.   09/20/21 Bike L4 x 6 min  Step ups airex on 6 in box x10 each Resisted gait 30lb w/ 6 in step up x 8 40lb resisted side step x8 each  4in eccentric step downs 2x12 Leg press 60lb 2x12 S2S 10lb 2x10 Hamstring curls 35lb 2x12 Leg Ext 10lb 2x10, RLE 5lb x5  09/14/22 Bike L4 x6 min  Leg press 30lb 3x12  Hamstring curls 35lb 2x12 Leg  Ext 10lb 2x10 Lateral 6in step ups x10 Heel raises 2x15 Step ups 6in x10 each  S2S with yellow ball 2x10   09/06/22 NuStep L5 x 6 minutes Supine strengthening-SLR- 5# x 10 and 4# x 10 Sidelying hip abd, 2 x 10  Sidelie-hip abd with forward and back slow swing of R leg, x 5 Prone TKE on toes. 2 x 10 reps Prone hip ext with knee straight , then with knee bent, 2 x 10 each Seated-rotate to R, slide hands out to side and back x 5 for stretch and trunk strengthening. Supine with legs over physioball in IR, bridge with hold, x 10 reps  B heel raises on bar x 20 reps Vaso to R knee 10 minutes, 34, mod pressure.  08/30/22 Bike L3.5 x 6 min  Leg press 30lb x12, 50lb x12 6in lateral step ups x12 eacn  4in eccentric step downs 2x12 Hamstring curls 35lb 2x10  Leg Ext 10lb 2x12, RLE Ext 5lb 2x5 Hip Ext, abd, add 5lb x 10 each Heel raises black bar 2x15     PATIENT EDUCATION:  Education details: HEP and POC Person  educated: Patient Education method: Consulting civil engineer, Demonstration, and Verbal cues Education comprehension: verbalized understanding, returned demonstration, and tactile cues required   HOME EXERCISE PROGRAM:  [id number]   ASSESSMENT:   CLINICAL IMPRESSION: Treatment emphasized strength and stability of LE. Increase weight tolerated with machine level interventions. No reports of pain during session. More stable on R side with SL catch. Continue with strengthening of R knee to increase stability of joint and decrease meniscal pain.     OBJECTIVE IMPAIRMENTS: decreased activity tolerance, decreased coordination, decreased endurance, difficulty walking, decreased strength, impaired flexibility, postural dysfunction, and pain.    ACTIVITY LIMITATIONS: bending, squatting, stairs, and locomotion level   PARTICIPATION LIMITATIONS: driving, shopping, occupation, and yard work   PERSONAL FACTORS: Past/current experiences are also affecting patient's functional outcome.    REHAB POTENTIAL: Good   CLINICAL DECISION MAKING: Stable/uncomplicated   EVALUATION COMPLEXITY: Low     GOALS: Goals reviewed with patient? Yes   SHORT TERM GOALS: Target date: 09/07/22 I with initial HEP Baseline: Goal status: Met 08/23/22   LONG TERM GOALS: Target date: 11/09/22   I with final HEP Baseline:  Goal status: INITIAL   2.  Increase B hip strength to at least 4+/5 Baseline: 4- Goal status: ongoing   3.  Patient will return to her full work day with R knee pain < 3/10 Baseline:  Goal status: ongoing   4.  Patient will report increased confidence in her R knee in all situations rather than guarding it when turning or lifting. Baseline:  Goal status: INITIAL   PLAN:   PT FREQUENCY: 1x/week   PT DURATION: 12 weeks   PLANNED INTERVENTIONS: Therapeutic exercises, Therapeutic activity, Neuromuscular re-education, Balance training, Gait training, Patient/Family education, Self Care, Joint  mobilization, Stair training, Dry Needling, Electrical stimulation, Cryotherapy, Moist heat, Vasopneumatic device, Ionotophoresis '4mg'$ /ml Dexamethasone, and Manual therapy   PLAN FOR NEXT SESSION: Assess tolerance to HEP, update for strengthening.    Marcelina Morel, DPT 10/11/2022, 8:41 AM

## 2022-10-10 DIAGNOSIS — M25671 Stiffness of right ankle, not elsewhere classified: Secondary | ICD-10-CM | POA: Diagnosis not present

## 2022-10-10 DIAGNOSIS — S83241A Other tear of medial meniscus, current injury, right knee, initial encounter: Secondary | ICD-10-CM | POA: Diagnosis not present

## 2022-10-10 DIAGNOSIS — M25561 Pain in right knee: Secondary | ICD-10-CM | POA: Diagnosis not present

## 2022-10-10 DIAGNOSIS — M25551 Pain in right hip: Secondary | ICD-10-CM | POA: Diagnosis not present

## 2022-10-10 DIAGNOSIS — M25851 Other specified joint disorders, right hip: Secondary | ICD-10-CM | POA: Diagnosis not present

## 2022-10-10 DIAGNOSIS — M1711 Unilateral primary osteoarthritis, right knee: Secondary | ICD-10-CM | POA: Diagnosis not present

## 2022-10-10 DIAGNOSIS — M7918 Myalgia, other site: Secondary | ICD-10-CM | POA: Diagnosis not present

## 2022-10-10 DIAGNOSIS — M9906 Segmental and somatic dysfunction of lower extremity: Secondary | ICD-10-CM | POA: Diagnosis not present

## 2022-10-11 ENCOUNTER — Ambulatory Visit: Payer: 59 | Admitting: Adult Health

## 2022-10-11 ENCOUNTER — Ambulatory Visit: Payer: 59

## 2022-10-11 DIAGNOSIS — R278 Other lack of coordination: Secondary | ICD-10-CM

## 2022-10-11 DIAGNOSIS — M6281 Muscle weakness (generalized): Secondary | ICD-10-CM

## 2022-10-11 DIAGNOSIS — R262 Difficulty in walking, not elsewhere classified: Secondary | ICD-10-CM | POA: Diagnosis not present

## 2022-10-11 DIAGNOSIS — G8929 Other chronic pain: Secondary | ICD-10-CM | POA: Diagnosis not present

## 2022-10-11 DIAGNOSIS — M25561 Pain in right knee: Secondary | ICD-10-CM | POA: Diagnosis not present

## 2022-10-11 NOTE — Progress Notes (Signed)
Patient was supposed to be scheduled for therapy

## 2022-10-13 ENCOUNTER — Ambulatory Visit (INDEPENDENT_AMBULATORY_CARE_PROVIDER_SITE_OTHER): Payer: 59 | Admitting: Behavioral Health

## 2022-10-13 DIAGNOSIS — F32A Depression, unspecified: Secondary | ICD-10-CM

## 2022-10-13 DIAGNOSIS — F411 Generalized anxiety disorder: Secondary | ICD-10-CM

## 2022-10-13 NOTE — Progress Notes (Unsigned)
      [facility] Counselor/Therapist Progress Note  Patient ID: Tina Scott, MRN: [id number],    Date: 10/13/2022  Time Spent: ***   Treatment Type: {CHL AMB THERAPY [phone]}  Reported Symptoms: ***  Mental Status Exam:  Appearance:   {PSY:22683}     Behavior:  {PSY:21022743}  Motor:  {PSY:22302}  Speech/Language:   {PSY:22685}  Affect:  {PSY:22687}  Mood:  {PSY:31886}  Thought process:  {PSY:31888}  Thought content:    {PSY:[phone]}  Sensory/Perceptual disturbances:    {PSY:[id number]}  Orientation:  {PSY:30297}  Attention:  {PSY:22877}  Concentration:  {[phone]}  Memory:  {[phone]}  Fund of knowledge:   {[phone]}  Insight:    {[phone]}  Judgment:   {[phone]}  Impulse Control:  {[phone]}   Risk Assessment: Danger to Self:  {PSY:22692} Self-injurious Behavior: {PSY:22692} Danger to Others: {PSY:22692} Duty to Warn:{PSY:311194} Physical Aggression / Violence:{PSY:21197} Access to Firearms a concern: {PSY:21197} Gang Involvement:{PSY:21197}  Subjective: ***   Interventions: {[phone]}  Diagnosis:No diagnosis found.  Plan: ***  Jannifer Hick, [facility]
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
## 2022-10-16 ENCOUNTER — Ambulatory Visit (HOSPITAL_COMMUNITY): Payer: Self-pay | Admitting: Psychiatry

## 2022-10-16 ENCOUNTER — Encounter: Payer: Self-pay | Admitting: Behavioral Health

## 2022-10-16 NOTE — Therapy (Signed)
[facility] OUTPATIENT PHYSICAL THERAPY TREATMENT NOTE   Patient Name: Tina Scott MRN: [id number] DOB:1966-01-21, 57 y.o., female Today's Date: 10/17/2022  PCP:  Flossie Buffy, NP  REFERRING PROVIDER: Rosemarie Ax, MD   END OF SESSION:   PT End of Session - 10/17/22 0808     Visit Number 9    Date for PT Re-Evaluation 11/09/22    PT Start Time 0806    PT Stop Time 0845    PT Time Calculation (min) 39 min    Activity Tolerance Patient tolerated treatment well    Behavior During Therapy [facility] for tasks assessed/performed               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   Left Shoulder - anterior   Past Surgical History:  Procedure Laterality Date   CESAREAN SECTION     x2   COLONOSCOPY     HERNIA REPAIR     Hernia Revision also.    POLYPECTOMY     TUBAL LIGATION     Patient Active Problem List   Diagnosis Date Noted   GAD (generalized anxiety disorder) 07/06/2022   Peroneal tendinitis of left lower leg 06/14/2022   Degenerative tear of medial meniscus of right knee 05/15/2022   Pseudopolyposis of colon without complication, unspecified part of colon ([place]) 11/30/2021   Vitamin D insufficiency 06/16/2021   Hyperparathyroidism ([place]) 03/09/2021   Hypercalcemia 11/25/2020   Hx of basal cell carcinoma excision 11/24/2020   Hx of colonic polyp 05/25/2020   Pure hypercholesterolemia 05/25/2020   Carpal tunnel syndrome on right 01/02/2017   Essential hypertension 08/18/2016    REFERRING DIAG:  M23.203 (ICD-10-CM) - Degenerative tear of medial meniscus of right knee   THERAPY DIAG:  Chronic pain of right knee  Muscle weakness (generalized)  Other lack of coordination  Difficulty in walking, not elsewhere classified  Rationale for Evaluation and Treatment Rehabilitation  PERTINENT HISTORY: Increasing R knee pain. Received injection  07/28/22. Degenerative tear of medial meniscus of right knee Acute worsening of her underlying knee pain. -Counseled on home exercise therapy and supportive care. -Refer to physical therapy. -Injection today. -Could consider further imaging or gel injection  PRECAUTIONS: None  SUBJECTIVE:                                                                                                                                                                                      SUBJECTIVE STATEMENT:   I feel overall better.  Worked 2 12 hr shifts and those shifts make my R knee  hurt.     PAIN:  Are you having pain? Yes: NPRS scale: 3/10 R knee   OBJECTIVE: (objective measures completed at initial evaluation unless otherwise dated)  DIAGNOSTIC FINDINGS: X ray in August showed no fracture or arthritis per patient.     COGNITION: Overall cognitive status: Within functional limits for tasks assessed                         SENSATION: Light touch: WFL   EDEMA:  Patient denies edema-wears ice pack on knee at night.   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   LOWER EXTREMITY ROM: WNL except B hip IR mildly diminished, increased B hip ER.     LOWER EXTREMITY MMT: All strength 5/5 unless noted.   MMT Right eval Left eval  Hip flexion 4-    Hip extension 4- 4-  Hip abduction 4- 4-  Hip adduction      Hip internal rotation      Hip external rotation      Knee flexion      Knee extension      Ankle dorsiflexion      Ankle plantarflexion      Ankle inversion      Ankle eversion          LOWER EXTREMITY SPECIAL TESTS:  Per Dr Raeford Razor- R meniscal tear.   GAIT: Distance walked: in clinic Assistive device utilized: None Level of assistance: Complete  Independence Comments: Wearing knee brace on R, labored     TODAY'S TREATMENT:        10/17/22: Scifit bike 6 min, level 4 Sit to stands with black t band sustained isometric abduction 12x Toe rasies B forefeet on 2" step 20 reps Unilateral standing    with reaches diagonally across midline, to isolate deep hip stability/post chain strength   Heel taps 2" step forward, for eccentric control distal quads and lateral/ deep hip  Leg press 60# 12 reps x 3 sets Facing wall for ladder climbs, ipsilateral arm and leg flexion, 15 reps each to isolate/ control opposite hipgluts Bridges off table to further isolate post hips                                                                                                               DATE:  10/11/22 Bike L4 x60mns  Leg ext 15# 2x10 HS curls 35# 2x10  Leg press 60# 2x10 STS with yellow ball chest press 2x10  Step ups 6" and then lateral step ups  Calf stretch 30s  Calf raises 2x12  Resisted gait over obstacles x5 each side 40#  SL catch with red ball    09/27/22 Elliptical L2 x 5 min Hamstring curls 35lb 3x12  Leg Ext 10lb 2x12, RLE 5lb x5  4in eccentric step downs 2x12 Leg press 60lb 3x15 S2S 5lb 2x12 Side step over foam roll on airex Heel raises.   09/20/21 Bike L4 x 6 min  Step ups airex on 6 in box x10 each Resisted gait 30lb w/ 6 in step up x 8 40lb resisted side step x8 each  4in eccentric step downs 2x12 Leg press 60lb 2x12 S2S 10lb 2x10 Hamstring curls 35lb 2x12 Leg Ext 10lb 2x10, RLE 5lb x5  09/14/22 Bike L4 x6 min  Leg press 30lb 3x12  Hamstring curls 35lb 2x12 Leg Ext 10lb 2x10 Lateral 6in step ups x10 Heel raises 2x15 Step ups 6in x10 each  S2S with yellow ball 2x10   09/06/22 NuStep L5 x 6 minutes Supine strengthening-SLR- 5# x 10 and 4# x 10 Sidelying hip abd, 2 x 10  Sidelie-hip abd with forward and back slow swing of R leg, x 5 Prone TKE on toes. 2 x 10 reps Prone hip ext with knee straight , then  with knee bent, 2 x 10 each Seated-rotate to R, slide hands out to side and back x 5 for stretch and trunk strengthening. Supine with legs over physioball in IR, bridge with hold, x 10 reps  B heel raises on bar x 20 reps Vaso to R knee 10 minutes, 34, mod pressure.  08/30/22 Bike L3.5 x 6 min  Leg press 30lb x12, 50lb x12 6in lateral step ups x12 eacn  4in eccentric step downs 2x12 Hamstring curls 35lb 2x10  Leg Ext 10lb 2x12, RLE Ext 5lb 2x5 Hip Ext, abd, add 5lb x 10 each Heel raises black bar 2x15     PATIENT EDUCATION:  Education details: HEP and POC, transitioning more to proprioceptive awareness, fine control f hip stabilizing musculature Person educated: Patient Education method: Explanation, Demonstration, and Verbal cues Education comprehension: verbalized understanding, returned demonstration, and tactile cues required   HOME EXERCISE PROGRAM:  [email or web address]  Access Code: [email or web address] URL: [email or web address] Date: 10/17/2022 Prepared by:    Exercises - Forward Step Down Touch with Heel  - 1 x daily - 7 x weekly - 3 sets - 10 reps - Single Leg Running Balance  - 1 x daily - 7 x weekly - 3 sets - 10 reps - Supine Single Leg Hip Extension on Bench  - 1 x daily - 7 x weekly - 3 sets - 10 reps - Standing Isometric Single Leg Heel Raise at Wall  - 1 x daily - 7 x weekly - 3 sets - 10 reps ASSESSMENT:   CLINICAL IMPRESSION: Treatment emphasized strength and stability of LE. Increase weight tolerated with machine level interventions. No reports of pain during session. More stable on R side with SL catch. Continue with strengthening of R knee to increase stability of joint and decrease meniscal pain.     OBJECTIVE IMPAIRMENTS: decreased activity tolerance, decreased coordination, decreased endurance, difficulty walking, decreased strength, impaired flexibility, postural dysfunction, and pain.    ACTIVITY LIMITATIONS: bending, squatting, stairs, and  locomotion level   PARTICIPATION LIMITATIONS: driving, shopping, occupation, and yard work   PERSONAL FACTORS: Past/current experiences are also affecting patient's functional outcome.    REHAB POTENTIAL: Good   CLINICAL DECISION MAKING: Stable/uncomplicated   EVALUATION COMPLEXITY: Low     GOALS: Goals reviewed with patient? Yes   SHORT TERM GOALS: Target date: 09/07/22 I with initial HEP Baseline: Goal status: Met 08/23/22   LONG TERM GOALS: Target date: 11/09/22   I with final HEP Baseline:  Goal status: INITIAL  10/17/22:  progressing well    2.  Increase B hip strength to at least 4+/5 Baseline: 4- Goal status: ongoing   3.  Patient will return to her full work day with R knee pain < 3/10 Baseline:  Goal status: ongoing1/30/24:  working 2 12 hr shifts   4.  Patient will report increased confidence in her R knee in all situations rather than guarding it when turning or lifting. Baseline:  Goal status: ongoing 10/17/22, transitioned HEP today to address fine motor. Proprioceptive awareness motor recruitment   PLAN:   PT FREQUENCY: 1x/week   PT DURATION: 12 weeks   PLANNED INTERVENTIONS: Therapeutic exercises, Therapeutic activity, Neuromuscular re-education, Balance training, Gait training, Patient/Family education, Self Care, Joint mobilization, Stair training, Dry Needling, Electrical stimulation, Cryotherapy, Moist heat, Vasopneumatic device, Ionotophoresis '4mg'$ /ml Dexamethasone, and Manual therapy   PLAN FOR NEXT SESSION: Assess tolerance to HEP, update for strengthening.    Marcelina Morel, DPT 10/17/2022, 8:57 AM

## 2022-10-17 ENCOUNTER — Other Ambulatory Visit: Payer: Self-pay

## 2022-10-17 ENCOUNTER — Ambulatory Visit: Payer: 59

## 2022-10-17 DIAGNOSIS — G8929 Other chronic pain: Secondary | ICD-10-CM | POA: Diagnosis not present

## 2022-10-17 DIAGNOSIS — M6281 Muscle weakness (generalized): Secondary | ICD-10-CM

## 2022-10-17 DIAGNOSIS — R278 Other lack of coordination: Secondary | ICD-10-CM

## 2022-10-17 DIAGNOSIS — R262 Difficulty in walking, not elsewhere classified: Secondary | ICD-10-CM

## 2022-10-17 DIAGNOSIS — M25561 Pain in right knee: Secondary | ICD-10-CM | POA: Diagnosis not present

## 2022-10-23 ENCOUNTER — Ambulatory Visit (INDEPENDENT_AMBULATORY_CARE_PROVIDER_SITE_OTHER): Payer: 59 | Admitting: Behavioral Health

## 2022-10-23 DIAGNOSIS — F32A Depression, unspecified: Secondary | ICD-10-CM

## 2022-10-23 DIAGNOSIS — F411 Generalized anxiety disorder: Secondary | ICD-10-CM | POA: Diagnosis not present

## 2022-10-23 NOTE — Progress Notes (Unsigned)
[facility] Counselor/Therapist Progress Note  Patient ID: Tina Scott, MRN: [id number],    Date: 10/25/2022  Time Spent: 50 minutes   Treatment Type: Individual Therapy  Reported Symptoms: Anxiety   Mental Status Exam:  Appearance:   Casual     Behavior:  Appropriate and Sharing  Motor:  Normal  Speech/Language:   Clear and Coherent  Affect:  Appropriate and Congruent  Mood:  normal  Thought process:  normal  Thought content:    WNL  Sensory/Perceptual disturbances:    WNL  Orientation:  oriented to person  Attention:  Good  Concentration:  Good  Memory:  WNL  Fund of knowledge:   Good  Insight:    Good  Judgment:   Good  Impulse Control:  Good   Risk Assessment: Danger to Self:  No Self-injurious Behavior: No Danger to Others: No Duty to Warn:no Physical Aggression / Violence:No  Access to Firearms a concern: No  Gang Involvement:No   Subjective:   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 She states to have support from her job regarding her work related responsibilities creating anxiety and stress. The patient reports coping by utilizing positive self talk reminding herself she has support. She reports when she went to visit her parents she was not anxious due to having an understanding of her role and others that are helping to take care of her mom that has dementia. She states coping by pre planning ahead has helped. She identified triggers for anxiety as "Just the constant worrying preparing for the what if's, but also worrying about the what if's". The patient reports belief that her anxiety has improved and states she is uncertain if it is due to her medication regimen.    She states when she is at work a Retail [employer] for anxiety is "Worrying about not getting my job done". She rated her anxiety level at a 5 today and rated depression at a 0. She reports belief that in order for her anxiety level to  decrease she would need "To not go to work tomorrow". She reports she is attempting to cope by staying in the present moment.  She reports practicing self care by utilizing distraction techniques. She states interest with receiving therapy to assist her with mood stability. The patient states interest with eventually discontinuing her medication regimen of blood pressure medication and Lexapro. She reports plans to monitor her medication regimen for approximately 6 months to a year before tapering off her medication. The patient denied needing any community resources to assist her at this time. She expressed gratitude with being able to have support in place currently to assist her. The patient denied SI/HI, AH/VH.   Counselor conducted check in. Counselor questioned mental health symptoms and requested the patient rate the symptoms. Counselor questioned triggers for anxiety and irrational thoughts. Counselor encouraged the patient to continue to monitor her internal dialogue and utilize positive self talk. The counselor questioned coping strategies utilized and discussed her medication regimen. The counselor discussed challenging irrational thoughts and practicing self care. The counselor questioned if the patient needed community resources to assist her with addressing any personal problems. The counselor assessed for SI/HI, AH/VH.   Interventions: Cognitive Behavioral Therapy and Solution-Oriented/Positive Psychology  Diagnosis:   ICD-10-CM   1. Anxiety state  F41.1     2. Depression, unspecified depression type  F32.A       Plan:   Long  Term Goal: Develop strategies to reduce symptoms. Short Term Goal: Reduce anxiety and improve coping skills. Objective: Develop strategies for thought distraction when fixating on the future. Objective: Recognize and plan for anxiety provoking situations.    Long Term Goal: Learn and use effective communication strategies at work due to being required to  fulfill the charge nurse duty whenever short staff. Short Term Goal: Learn and implement problem solving and conflict resolution. Objective: Learn and implement effective communication methods to reduce conflict.    Long Term Goal: Maintain stability of mood. Short Term Goal: Increase ability to manage moods. Objective: Achieve a level of symptom stability that will allow for the patient to discontinue psychotropic medication regimen at her interest.     Jannifer Hick, [facility]

## 2022-10-25 ENCOUNTER — Encounter: Payer: Self-pay | Admitting: Behavioral Health

## 2022-10-25 ENCOUNTER — Ambulatory Visit: Payer: 59 | Attending: Family Medicine

## 2022-10-25 ENCOUNTER — Other Ambulatory Visit: Payer: Self-pay

## 2022-10-25 DIAGNOSIS — M6281 Muscle weakness (generalized): Secondary | ICD-10-CM | POA: Insufficient documentation

## 2022-10-25 DIAGNOSIS — G8929 Other chronic pain: Secondary | ICD-10-CM | POA: Insufficient documentation

## 2022-10-25 DIAGNOSIS — M25561 Pain in right knee: Secondary | ICD-10-CM | POA: Insufficient documentation

## 2022-10-25 DIAGNOSIS — R278 Other lack of coordination: Secondary | ICD-10-CM | POA: Insufficient documentation

## 2022-10-25 DIAGNOSIS — R262 Difficulty in walking, not elsewhere classified: Secondary | ICD-10-CM | POA: Insufficient documentation

## 2022-10-25 NOTE — Therapy (Signed)
[facility] OUTPATIENT PHYSICAL THERAPY TREATMENT NOTE   Patient Name: Tina Scott MRN: [id number] DOB:June 15, 1966, 57 y.o., female Today's Date: 10/25/2022  PCP:  Flossie Buffy, NP  REFERRING PROVIDER: Rosemarie Ax, MD   END OF SESSION:   PT End of Session - 10/25/22 0811     Visit Number 10    Date for PT Re-Evaluation 11/09/22    PT Start Time 0803    PT Stop Time 0847    PT Time Calculation (min) 44 min    Activity Tolerance Patient tolerated treatment well    Behavior During Therapy [facility] for tasks assessed/performed                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   Left Shoulder - anterior   Past Surgical History:  Procedure Laterality Date   CESAREAN SECTION     x2   COLONOSCOPY     HERNIA REPAIR     Hernia Revision also.    POLYPECTOMY     TUBAL LIGATION     Patient Active Problem List   Diagnosis Date Noted   GAD (generalized anxiety disorder) 07/06/2022   Peroneal tendinitis of left lower leg 06/14/2022   Degenerative tear of medial meniscus of right knee 05/15/2022   Pseudopolyposis of colon without complication, unspecified part of colon ([place]) 11/30/2021   Vitamin D insufficiency 06/16/2021   Hyperparathyroidism ([place]) 03/09/2021   Hypercalcemia 11/25/2020   Hx of basal cell carcinoma excision 11/24/2020   Hx of colonic polyp 05/25/2020   Pure hypercholesterolemia 05/25/2020   Carpal tunnel syndrome on right 01/02/2017   Essential hypertension 08/18/2016    REFERRING DIAG:  M23.203 (ICD-10-CM) - Degenerative tear of medial meniscus of right knee   THERAPY DIAG:  Chronic pain of right knee  Muscle weakness (generalized)  Other lack of coordination  Difficulty in walking, not elsewhere classified  Rationale for Evaluation and Treatment Rehabilitation  PERTINENT HISTORY: Increasing R knee pain. Received injection  07/28/22. Degenerative tear of medial meniscus of right knee Acute worsening of her underlying knee pain. -Counseled on home exercise therapy and supportive care. -Refer to physical therapy. -Injection today. -Could consider further imaging or gel injection  PRECAUTIONS: None  SUBJECTIVE:                                                                                                                                                                                      SUBJECTIVE STATEMENT:  Just completed 13 hrs of work, so I am in pain as expected, but I can still  walk, function.  I do feel like the hip strengthening that we did last week was very helpful.  Anything that I can do on machines additionally here, that I can try at home on my machines and replicate would be great.   PAIN:  Are you having pain? Yes: NPRS scale: 5/10 R knee   OBJECTIVE: (objective measures completed at initial evaluation unless otherwise dated)  DIAGNOSTIC FINDINGS: X ray in August showed no fracture or arthritis per patient.     COGNITION: Overall cognitive status: Within functional limits for tasks assessed                         SENSATION: Light touch: WFL   EDEMA:  Patient denies edema-wears ice pack on knee at night.   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   LOWER EXTREMITY ROM: WNL except B hip IR mildly diminished, increased B hip ER.     LOWER EXTREMITY MMT: All strength 5/5 unless noted.   MMT Right eval Left eval  Hip flexion 4-    Hip extension 4- 4-  Hip abduction 4- 4-  Hip adduction      Hip internal rotation      Hip external rotation      Knee flexion      Knee extension      Ankle dorsiflexion      Ankle plantarflexion      Ankle inversion       Ankle eversion          LOWER EXTREMITY SPECIAL TESTS:  Per Dr Raeford Razor- R meniscal tear.   GAIT: Distance walked: in clinic Assistive device utilized: None Level of assistance: Complete Independence Comments: Wearing knee brace on R, labored     TODAY'S TREATMENT:     10/25/22: Scifit bike 7 min, level 4 Sit to stands with 2# mediball 2 sets 10 Toe raises B forefeet on 2" step 20 reps Heel taps 4" step forward, for eccentric control distal quads and lateral/ deep hip  Standing pulley for 3 way hip, 5#, 10 reps each  Facing wall for ladder climbs, ipsilateral arm and leg flexion, 15 reps each to isolate/ control opposite hip gluts Standing near wall for lateral isometric hip abd and hand slides to activate lateral hip and knee musculature  Vaso pneumatic compression R LE with R leg elevated on wedge , 10 min, 34 degrees with knee sleeve to reduce edema and pain post exercise      10/17/22: Scifit bike 6 min, level 4 Sit to stands with black t band sustained isometric abduction 12x Toe rasies B forefeet on 2" step 20 reps Unilateral standing    with reaches diagonally across midline, to isolate deep hip stability/post chain strength   Heel taps 2" step forward, for eccentric control distal quads and lateral/ deep hip  Leg press 60# 12 reps x 3 sets Facing wall for ladder climbs, ipsilateral arm and leg flexion, 15 reps each to isolate/ control opposite hipgluts Bridges off table to further isolate post hips  DATE:  10/11/22 Bike L4 x35mns  Leg ext 15# 2x10 HS curls 35# 2x10  Leg press 60# 2x10 STS with yellow ball chest press 2x10  Step ups 6" and then lateral step ups  Calf stretch 30s  Calf raises 2x12  Resisted gait over obstacles x5 each side 40#  SL catch with red ball    09/27/22 Elliptical L2 x 5 min Hamstring curls 35lb 3x12  Leg Ext 10lb 2x12, RLE 5lb  x5 4in eccentric step downs 2x12 Leg press 60lb 3x15 S2S 5lb 2x12 Side step over foam roll on airex Heel raises.   09/20/21 Bike L4 x 6 min  Step ups airex on 6 in box x10 each Resisted gait 30lb w/ 6 in step up x 8 40lb resisted side step x8 each  4in eccentric step downs 2x12 Leg press 60lb 2x12 S2S 10lb 2x10 Hamstring curls 35lb 2x12 Leg Ext 10lb 2x10, RLE 5lb x5  09/14/22 Bike L4 x6 min  Leg press 30lb 3x12  Hamstring curls 35lb 2x12 Leg Ext 10lb 2x10 Lateral 6in step ups x10 Heel raises 2x15 Step ups 6in x10 each  S2S with yellow ball 2x10   09/06/22 NuStep L5 x 6 minutes Supine strengthening-SLR- 5# x 10 and 4# x 10 Sidelying hip abd, 2 x 10  Sidelie-hip abd with forward and back slow swing of R leg, x 5 Prone TKE on toes. 2 x 10 reps Prone hip ext with knee straight , then with knee bent, 2 x 10 each Seated-rotate to R, slide hands out to side and back x 5 for stretch and trunk strengthening. Supine with legs over physioball in IR, bridge with hold, x 10 reps  B heel raises on bar x 20 reps Vaso to R knee 10 minutes, 34, mod pressure.  08/30/22 Bike L3.5 x 6 min  Leg press 30lb x12, 50lb x12 6in lateral step ups x12 eacn  4in eccentric step downs 2x12 Hamstring curls 35lb 2x10  Leg Ext 10lb 2x12, RLE Ext 5lb 2x5 Hip Ext, abd, add 5lb x 10 each Heel raises black bar 2x15     PATIENT EDUCATION:  Education details: HEP and POC, transitioning more to proprioceptive awareness, fine control f hip stabilizing musculature Person educated: Patient Education method: Explanation, Demonstration, and Verbal cues Education comprehension: verbalized understanding, returned demonstration, and tactile cues required   HOME EXERCISE PROGRAM:  [email or web address]  Access Code: [email or web address] URL: [email or web address] Date: 10/17/2022 Prepared by:    Exercises - Forward Step Down Touch with Heel  - 1 x daily - 7 x weekly - 3 sets - 10 reps - Single Leg  Running Balance  - 1 x daily - 7 x weekly - 3 sets - 10 reps - Supine Single Leg Hip Extension on Bench  - 1 x daily - 7 x weekly - 3 sets - 10 reps - Standing Isometric Single Leg Heel Raise at Wall  - 1 x daily - 7 x weekly - 3 sets - 10 reps ASSESSMENT:   CLINICAL IMPRESSION: Patient returned today for skilled PT to  address her R knee pain.  She requested further instruction, refinement of therex to utilize at home, also [facility], requested guidance on which swimming strokes might be best.  Advised her to use care if utilizing frog kick due to shearing forces on knee jt.  Advanced some of the difficulty of some of her exercises today, still needs to emphasize hip strength.  She was a  little inflamed due to prolonged work shift yesterday and requested the vasopneumatic .   OBJECTIVE IMPAIRMENTS: decreased activity tolerance, decreased coordination, decreased endurance, difficulty walking, decreased strength, impaired flexibility, postural dysfunction, and pain.    ACTIVITY LIMITATIONS: bending, squatting, stairs, and locomotion level   PARTICIPATION LIMITATIONS: driving, shopping, occupation, and yard work   PERSONAL FACTORS: Past/current experiences are also affecting patient's functional outcome.    REHAB POTENTIAL: Good   CLINICAL DECISION MAKING: Stable/uncomplicated   EVALUATION COMPLEXITY: Low     GOALS: Goals reviewed with patient? Yes   SHORT TERM GOALS: Target date: 09/07/22 I with initial HEP Baseline: Goal status: Met 08/23/22   LONG TERM GOALS: Target date: 11/09/22   I with final HEP Baseline:  Goal status: INITIAL  10/17/22:  progressing well    2.  Increase B hip strength to at least 4+/5 Baseline: 4- Goal status: ongoing   3.  Patient will return to her full work day with R knee pain < 3/10 Baseline:  Goal status: ongoing1/30/24:  working 2 12 hr shifts   4.  Patient will report increased confidence in her R knee in all situations  rather than guarding it when turning or lifting. Baseline:  Goal status: ongoing 10/17/22, transitioned HEP today to address fine motor. Proprioceptive awareness motor recruitment   PLAN:   PT FREQUENCY: 1x/week   PT DURATION: 12 weeks   PLANNED INTERVENTIONS: Therapeutic exercises, Therapeutic activity, Neuromuscular re-education, Balance training, Gait training, Patient/Family education, Self Care, Joint mobilization, Stair training, Dry Needling, Electrical stimulation, Cryotherapy, Moist heat, Vasopneumatic device, Ionotophoresis '4mg'$ /ml Dexamethasone, and Manual therapy   PLAN FOR NEXT SESSION: Assess tolerance to HEP, update for strengthening, B hips, assess tolerance to new ex from today.      Marcelina Morel, DPT 10/25/2022, 10:14 AM

## 2022-10-30 DIAGNOSIS — M25551 Pain in right hip: Secondary | ICD-10-CM | POA: Diagnosis not present

## 2022-10-30 DIAGNOSIS — M1711 Unilateral primary osteoarthritis, right knee: Secondary | ICD-10-CM | POA: Diagnosis not present

## 2022-10-30 DIAGNOSIS — M25851 Other specified joint disorders, right hip: Secondary | ICD-10-CM | POA: Diagnosis not present

## 2022-10-30 DIAGNOSIS — M25561 Pain in right knee: Secondary | ICD-10-CM | POA: Diagnosis not present

## 2022-10-30 DIAGNOSIS — M9906 Segmental and somatic dysfunction of lower extremity: Secondary | ICD-10-CM | POA: Diagnosis not present

## 2022-10-30 DIAGNOSIS — M7918 Myalgia, other site: Secondary | ICD-10-CM | POA: Diagnosis not present

## 2022-10-30 DIAGNOSIS — S83241A Other tear of medial meniscus, current injury, right knee, initial encounter: Secondary | ICD-10-CM | POA: Diagnosis not present

## 2022-10-30 DIAGNOSIS — M25671 Stiffness of right ankle, not elsewhere classified: Secondary | ICD-10-CM | POA: Diagnosis not present

## 2022-11-01 ENCOUNTER — Ambulatory Visit: Payer: 59

## 2022-11-08 ENCOUNTER — Ambulatory Visit: Payer: 59

## 2022-11-08 ENCOUNTER — Other Ambulatory Visit: Payer: Self-pay

## 2022-11-08 DIAGNOSIS — G8929 Other chronic pain: Secondary | ICD-10-CM | POA: Diagnosis not present

## 2022-11-08 DIAGNOSIS — M6281 Muscle weakness (generalized): Secondary | ICD-10-CM

## 2022-11-08 DIAGNOSIS — M25561 Pain in right knee: Secondary | ICD-10-CM | POA: Diagnosis not present

## 2022-11-08 DIAGNOSIS — R262 Difficulty in walking, not elsewhere classified: Secondary | ICD-10-CM | POA: Diagnosis not present

## 2022-11-08 DIAGNOSIS — R278 Other lack of coordination: Secondary | ICD-10-CM | POA: Diagnosis not present

## 2022-11-08 NOTE — Therapy (Signed)
[facility] OUTPATIENT PHYSICAL THERAPY TREATMENT NOTE   Patient Name: Tina Scott MRN: [id number] DOB:02-Jun-1966, 57 y.o., female Today's Date: 11/08/2022  PCP:  Flossie Buffy, NP  REFERRING PROVIDER: Rosemarie Ax, MD   END OF SESSION:   PT End of Session - 11/08/22 1053     Visit Number 11    Date for PT Re-Evaluation 11/09/22    PT Start Time 1056    PT Stop Time 1145    PT Time Calculation (min) 49 min    Activity Tolerance Patient tolerated treatment well    Behavior During Therapy [facility] for tasks assessed/performed                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   Left Shoulder - anterior   Past Surgical History:  Procedure Laterality Date   CESAREAN SECTION     x2   COLONOSCOPY     HERNIA REPAIR     Hernia Revision also.    POLYPECTOMY     TUBAL LIGATION     Patient Active Problem List   Diagnosis Date Noted   GAD (generalized anxiety disorder) 07/06/2022   Peroneal tendinitis of left lower leg 06/14/2022   Degenerative tear of medial meniscus of right knee 05/15/2022   Pseudopolyposis of colon without complication, unspecified part of colon ([place]) 11/30/2021   Vitamin D insufficiency 06/16/2021   Hyperparathyroidism ([place]) 03/09/2021   Hypercalcemia 11/25/2020   Hx of basal cell carcinoma excision 11/24/2020   Hx of colonic polyp 05/25/2020   Pure hypercholesterolemia 05/25/2020   Carpal tunnel syndrome on right 01/02/2017   Essential hypertension 08/18/2016    REFERRING DIAG:  M23.203 (ICD-10-CM) - Degenerative tear of medial meniscus of right knee   THERAPY DIAG:  Chronic pain of right knee  Muscle weakness (generalized)  Other lack of coordination  Difficulty in walking, not elsewhere classified  Rationale for Evaluation and Treatment Rehabilitation  PERTINENT HISTORY: Increasing R knee pain. Received injection  07/28/22. Degenerative tear of medial meniscus of right knee Acute worsening of her underlying knee pain. -Counseled on home exercise therapy and supportive care. -Refer to physical therapy. -Injection today. -Could consider further imaging or gel injection  PRECAUTIONS: None  SUBJECTIVE:                                                                                                                                                                                      SUBJECTIVE STATEMENT:  Just completed 13 hrs of work, so I am in pain as expected, but I can still  walk, function.  I do feel like the hip strengthening that we did last week was very helpful.  Anything that I can do on machines additionally here, that I can try at home on my machines and replicate would be great.   PAIN:  Are you having pain? Yes: NPRS scale: 5/10 R knee   OBJECTIVE: (objective measures completed at initial evaluation unless otherwise dated)  DIAGNOSTIC FINDINGS: X ray in August showed no fracture or arthritis per patient.     COGNITION: Overall cognitive status: Within functional limits for tasks assessed                         SENSATION: Light touch: WFL   EDEMA:  Patient denies edema-wears ice pack on knee at night.   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   LOWER EXTREMITY ROM: WNL except B hip IR mildly diminished, increased B hip ER.     LOWER EXTREMITY MMT: All strength 5/5 unless noted.   MMT Right eval Left eval 11/08/22:  Hip flexion 4-   B 5/5  Hip extension 4- 4- B 4/5  Hip abduction 4- 4- R 5, L 4+  Hip adduction       Hip internal rotation       Hip external rotation       Knee flexion       Knee extension       Ankle dorsiflexion       Ankle  plantarflexion       Ankle inversion       Ankle eversion           LOWER EXTREMITY SPECIAL TESTS:  Per Dr Raeford Razor- R meniscal tear.   GAIT: Distance walked: in clinic Assistive device utilized: None Level of assistance: Complete Independence Comments: Wearing knee brace on R, labored     TODAY'S TREATMENT:   11/08/22:   Scifit bike 6 min, level 4 Sit to stands with 2# mediball 2 sets 10 Toe raises B forefeet on 2" step 20 reps Heel taps 4" step forward, for eccentric control distal quads and lateral/ deep hip  Standing pulley for 3 way hip, 5#, 10 reps each  65cm ball( green) for hip strengthening with ball on mat, donkey kicks, also supine bridging, supine bridging with LTR Game ready, vasopneumatic 10 min R knee at end of session for pain, edema management 10/25/22: Scifit bike 7 min, level 4 Sit to stands with 2# mediball 2 sets 10 Toe raises B forefeet on 2" step 20 reps Heel taps 4" step forward, for eccentric control distal quads and lateral/ deep hip  Standing pulley for 3 way hip, 5#, 10 reps each  Facing wall for ladder climbs, ipsilateral arm and leg flexion, 15 reps each to isolate/ control opposite hip gluts Standing near wall for lateral isometric hip abd and hand slides to activate lateral hip and knee musculature  Vaso pneumatic compression R LE with R leg elevated on wedge , 10 min, 34 degrees with knee sleeve to reduce edema and pain post exercise      10/17/22: Scifit bike 6 min, level 4 Sit to stands with black t band sustained isometric abduction 12x Toe rasies B forefeet on 2" step 20  reps Unilateral standing    with reaches diagonally across midline, to isolate deep hip stability/post chain strength   Heel taps 2" step forward, for eccentric control distal quads and lateral/ deep hip  Leg press 60# 12 reps x 3 sets Facing wall for ladder climbs, ipsilateral arm and leg flexion, 15 reps each to isolate/ control opposite hipgluts Bridges off table to  further isolate post hips                                                                                                               DATE:  10/11/22 Bike L4 x34mns  Leg ext 15# 2x10 HS curls 35# 2x10  Leg press 60# 2x10 STS with yellow ball chest press 2x10  Step ups 6" and then lateral step ups  Calf stretch 30s  Calf raises 2x12  Resisted gait over obstacles x5 each side 40#  SL catch with red ball    09/27/22 Elliptical L2 x 5 min Hamstring curls 35lb 3x12  Leg Ext 10lb 2x12, RLE 5lb x5 4in eccentric step downs 2x12 Leg press 60lb 3x15 S2S 5lb 2x12 Side step over foam roll on airex Heel raises.   09/20/21 Bike L4 x 6 min  Step ups airex on 6 in box x10 each Resisted gait 30lb w/ 6 in step up x 8 40lb resisted side step x8 each  4in eccentric step downs 2x12 Leg press 60lb 2x12 S2S 10lb 2x10 Hamstring curls 35lb 2x12 Leg Ext 10lb 2x10, RLE 5lb x5  09/14/22 Bike L4 x6 min  Leg press 30lb 3x12  Hamstring curls 35lb 2x12 Leg Ext 10lb 2x10 Lateral 6in step ups x10 Heel raises 2x15 Step ups 6in x10 each  S2S with yellow ball 2x10   09/06/22 NuStep L5 x 6 minutes Supine strengthening-SLR- 5# x 10 and 4# x 10 Sidelying hip abd, 2 x 10  Sidelie-hip abd with forward and back slow swing of R leg, x 5 Prone TKE on toes. 2 x 10 reps Prone hip ext with knee straight , then with knee bent, 2 x 10 each Seated-rotate to R, slide hands out to side and back x 5 for stretch and trunk strengthening. Supine with legs over physioball in IR, bridge with hold, x 10 reps  B heel raises on bar x 20 reps Vaso to R knee 10 minutes, 34, mod pressure.  08/30/22 Bike L3.5 x 6 min  Leg press 30lb x12, 50lb x12 6in lateral step ups x12 eacn  4in eccentric step downs 2x12 Hamstring curls 35lb 2x10  Leg Ext 10lb 2x12, RLE Ext 5lb 2x5 Hip Ext, abd, add 5lb x 10 each Heel raises black bar 2x15     PATIENT EDUCATION:  Education details: HEP and POC, transitioning more to  proprioceptive awareness, fine control f hip stabilizing musculature Person educated: Patient Education method: Explanation, Demonstration, and Verbal cues Education comprehension: verbalized understanding, returned demonstration, and tactile cues required   HOME EXERCISE PROGRAM:  [email or web address]  Access Code: [email or web address] URL: [email or web address] Date: 10/17/2022 Prepared by: AWarren Lacy    Exercises - Forward Step Down Touch with Heel  - 1 x daily - 7 x weekly - 3 sets - 10 reps - Single Leg Running Balance  - 1 x daily - 7 x weekly - 3 sets - 10 reps - Supine Single Leg Hip Extension on Bench  - 1 x daily - 7 x weekly - 3 sets - 10 reps - Standing Isometric Single Leg Heel Raise at Wall  - 1 x daily - 7 x weekly - 3 sets - 10 reps ASSESSMENT:   CLINICAL IMPRESSION: Patient returned today for skilled PT to  address her R knee pain from chronic med meniscus tear  She requested further instruction, refinement of therex today.  Reassessed hip strength, much improved hip abductor strength and hip flexor strength B.  Reiterated home routine today, emphasized gluteal, post hip strength and calf strength to assist with her overall LE stability.  She has a good grasp of home routine and is ready for discharge from formal physical therapy at this time.    OBJECTIVE IMPAIRMENTS: decreased activity tolerance, decreased coordination, decreased endurance, difficulty walking, decreased strength, impaired flexibility, postural dysfunction, and pain.    ACTIVITY LIMITATIONS: bending, squatting, stairs, and locomotion level   PARTICIPATION LIMITATIONS: driving, shopping, occupation, and yard work   PERSONAL FACTORS: Past/current experiences are also affecting patient's functional outcome.    REHAB POTENTIAL: Good   CLINICAL DECISION MAKING: Stable/uncomplicated   EVALUATION COMPLEXITY: Low     GOALS: Goals reviewed with patient? Yes   SHORT TERM GOALS: Target date: 09/07/22 I with  initial HEP Baseline: Goal status: Met 08/23/22   LONG TERM GOALS: Target date: 11/09/22   I with final HEP Baseline:  Goal status: INITIAL  10/17/22:  progressing well    2.  Increase B hip strength to at least 4+/5 11/08/22:  goal met overall  Baseline: 4- Goal status: ongoing   3.  Patient will return to her full work day with R knee pain < 3/10 Baseline:  Goal status: ongoing1/30/24:  working 2 12 hr shifts  11/08/22: pat reports that this goal is primarily met    4.  Patient will report increased confidence in her R knee in all situations rather than guarding it when turning or lifting. Baseline:  Goal status: ongoing 10/17/22, transitioned HEP today to address fine motor. Proprioceptive awareness motor recruitment 11/08/22: goal met, improved control   PLAN:   PT FREQUENCY: 1x/week   PT DURATION: 12 weeks   PLANNED INTERVENTIONS: Therapeutic exercises, Therapeutic activity, Neuromuscular re-education, Balance training, Gait training, Patient/Family education, Self Care, Joint mobilization, Stair training, Dry Needling, Electrical stimulation, Cryotherapy, Moist heat, Vasopneumatic device, Ionotophoresis 47m/ml Dexamethasone, and Manual therapy   PLAN FOR NEXT SESSION: Dc at this time    AHayden Pedro, PT,DPT 11/08/2022, 12:52 PM

## 2022-11-16 ENCOUNTER — Ambulatory Visit: Payer: 59 | Admitting: Physical Therapy

## 2022-11-20 ENCOUNTER — Ambulatory Visit (INDEPENDENT_AMBULATORY_CARE_PROVIDER_SITE_OTHER): Payer: 59 | Admitting: Behavioral Health

## 2022-11-20 DIAGNOSIS — F411 Generalized anxiety disorder: Secondary | ICD-10-CM | POA: Diagnosis not present

## 2022-11-20 NOTE — Progress Notes (Signed)
[facility] Counselor/Therapist Progress Note  Patient ID: Tina Scott, MRN: [id number],    Date: 11/22/2022  Time Spent: 60 minutes  Treatment Type: Psychotherapy  Reported Symptoms: Anxiety   Mental Status Exam:  Appearance:   Casual     Behavior:  Appropriate and Sharing  Motor:  Normal  Speech/Language:   Clear and Coherent  Affect:  Appropriate and Congruent  Mood:  normal  Thought process:  normal  Thought content:    WNL  Sensory/Perceptual disturbances:    WNL  Orientation:  oriented to person  Attention:  Good  Concentration:  Good  Memory:  WNL  Fund of knowledge:   Good  Insight:    Good  Judgment:   Good  Impulse Control:  Good   Risk Assessment: Danger to Self:  No Self-injurious Behavior: No Danger to Others: No pa Duty to Warn:no Physical Aggression / Violence:No  Access to Firearms a concern: No  Gang Involvement:No   Subjective:   The patient reports since last being seen "Still had some really good days, some days I did roll down hill". The patient states she has observed herself excessively worrying. She reports belief that she can manage her anxiety. She identified her trigger for worrying at work as "The chaos I have seen it can be". 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 The patient identified what is in her control and out of her control. The patient identified thinking errors she identify with as blowing things up, fortune telling, mind reading, negative lableing, setting the bar too high, self blaming, and self reflecting. The patient states having thoughts of "I will never get this, have I done something, have I hurt there feelings, did I deserve how my husband treated me, was this argument really my fought".  She states cognitive distortions as "mind reading, fortune telling, magnification minimazation, and emotional reasonig sometimes". The  patient states she has not experiened any panic attacks. The patient attempts to cope by listening to music. The patient rated her anxiety at a 5 intially and rated it at a 3 at the end of the session. The patient identified her take away from today's session as "I am still coming up a hill. I am still getting better and I am no were near where I was". The patient reports she remains med compliant. The counselor informed the patient her time is limited at [place].   The counselor conducted check in. The counselor discussed mental health symptoms experienced and requested for the patient to rate her symptoms. The counselor educated the patient on thinking errors and cognitive distortions. The counselor requested feedback from the patient on what cognitive distortions she can identify with. The counselor discussed medication compliance. The counselor educated the patient on the coping strategy of challenging irrational thoughts to assist her with coping with her mental health symptoms. The counselor encouraged the patient to incorporate positive self talk as a coping mechanism to assist her in distressing situations. The counselor provided the patient with psycho educational material. The counselor assessed for SI/HI, AH/VH. The counselor informed the patient her time is limited at [place].    Interventions: Cognitive Behavioral Therapy  Diagnosis:   ICD-10-CM   1. Anxiety state  F41.1       Plan:   Long Term Goal: Develop strategies to reduce symptoms. Short Term Goal: Reduce anxiety  and improve coping skills. Objective: Develop strategies for thought distraction when fixating on the future. Objective: Recognize and plan for anxiety provoking situations.    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 Short Term Goal: Learn and implement problem  solving and conflict resolution. Objective: Learn and implement effective communication methods to reduce conflict.    Long Term Goal: Maintain stability of mood. Short Term Goal: Increase ability to manage moods. Objective: Achieve a level of symptom stability that will allow for the patient to discontinue psychotropic medication regimen at her interest.        Jannifer Hick, [facility]

## 2022-11-22 ENCOUNTER — Encounter: Payer: Self-pay | Admitting: Behavioral Health

## 2022-11-22 DIAGNOSIS — M25851 Other specified joint disorders, right hip: Secondary | ICD-10-CM | POA: Diagnosis not present

## 2022-11-22 DIAGNOSIS — M9906 Segmental and somatic dysfunction of lower extremity: Secondary | ICD-10-CM | POA: Diagnosis not present

## 2022-11-22 DIAGNOSIS — M7918 Myalgia, other site: Secondary | ICD-10-CM | POA: Diagnosis not present

## 2022-11-22 DIAGNOSIS — M25551 Pain in right hip: Secondary | ICD-10-CM | POA: Diagnosis not present

## 2022-11-22 DIAGNOSIS — M25671 Stiffness of right ankle, not elsewhere classified: Secondary | ICD-10-CM | POA: Diagnosis not present

## 2022-11-22 DIAGNOSIS — S83241A Other tear of medial meniscus, current injury, right knee, initial encounter: Secondary | ICD-10-CM | POA: Diagnosis not present

## 2022-11-22 DIAGNOSIS — M25561 Pain in right knee: Secondary | ICD-10-CM | POA: Diagnosis not present

## 2022-11-22 DIAGNOSIS — M1711 Unilateral primary osteoarthritis, right knee: Secondary | ICD-10-CM | POA: Diagnosis not present

## 2022-11-23 ENCOUNTER — Ambulatory Visit: Payer: 59 | Admitting: Physical Therapy

## 2022-12-05 ENCOUNTER — Ambulatory Visit (INDEPENDENT_AMBULATORY_CARE_PROVIDER_SITE_OTHER): Payer: 59 | Admitting: Nurse Practitioner

## 2022-12-05 ENCOUNTER — Encounter: Payer: Self-pay | Admitting: Nurse Practitioner

## 2022-12-05 ENCOUNTER — Other Ambulatory Visit (HOSPITAL_BASED_OUTPATIENT_CLINIC_OR_DEPARTMENT_OTHER): Payer: Self-pay

## 2022-12-05 VITALS — BP 130/78 | HR 64 | Temp 98.7°F | Resp 16 | Ht 64.0 in | Wt 226.0 lb

## 2022-12-05 DIAGNOSIS — Z1283 Encounter for screening for malignant neoplasm of skin: Secondary | ICD-10-CM

## 2022-12-05 DIAGNOSIS — E7800 Pure hypercholesterolemia, unspecified: Secondary | ICD-10-CM

## 2022-12-05 DIAGNOSIS — I10 Essential (primary) hypertension: Secondary | ICD-10-CM

## 2022-12-05 DIAGNOSIS — Z0001 Encounter for general adult medical examination with abnormal findings: Secondary | ICD-10-CM | POA: Diagnosis not present

## 2022-12-05 DIAGNOSIS — Z86010 Personal history of colonic polyps: Secondary | ICD-10-CM | POA: Diagnosis not present

## 2022-12-05 DIAGNOSIS — E559 Vitamin D deficiency, unspecified: Secondary | ICD-10-CM | POA: Diagnosis not present

## 2022-12-05 DIAGNOSIS — E213 Hyperparathyroidism, unspecified: Secondary | ICD-10-CM | POA: Diagnosis not present

## 2022-12-05 LAB — LIPID PANEL
Cholesterol: 252 mg/dL — ABNORMAL HIGH (ref 0–200)
HDL: 52.5 mg/dL (ref >39.00)
LDL Cholesterol: 183 mg/dL — ABNORMAL HIGH (ref 0–99)
NonHDL: 199.73
Total CHOL/HDL Ratio: 5
Triglycerides: 86 mg/dL (ref 0.0–149.0)
VLDL: 17.2 mg/dL (ref 0.0–40.0)

## 2022-12-05 LAB — COMPREHENSIVE METABOLIC PANEL
ALT: 11 U/L (ref 0–35)
AST: 10 U/L (ref 0–37)
Albumin: 4.3 g/dL (ref 3.5–5.2)
Alkaline Phosphatase: 80 U/L (ref 39–117)
BUN: 10 mg/dL (ref 6–23)
CO2: 27 mEq/L (ref 19–32)
Calcium: 10.9 mg/dL — ABNORMAL HIGH (ref 8.4–10.5)
Chloride: 105 mEq/L (ref 96–112)
Creatinine, Ser: 0.65 mg/dL (ref 0.40–1.20)
GFR: 99 mL/min (ref >60.00)
Glucose, Bld: 76 mg/dL (ref 70–99)
Potassium: 4.2 mEq/L (ref 3.5–5.1)
Sodium: 140 mEq/L (ref 135–145)
Total Bilirubin: 0.6 mg/dL (ref 0.2–1.2)
Total Protein: 7.2 g/dL (ref 6.0–8.3)

## 2022-12-05 LAB — VITAMIN D 25 HYDROXY (VIT D DEFICIENCY, FRACTURES): VITD: 32.34 ng/mL (ref 30.00–100.00)

## 2022-12-05 MED ORDER — LISINOPRIL 40 MG PO TABS
40.0000 mg | ORAL_TABLET | Freq: Every day | ORAL | 3 refills | Status: DC
  Filled 2022-12-05 – 2022-12-18 (×2): qty 90, 90d supply, fill #0
  Filled 2023-03-29: qty 90, 90d supply, fill #1
  Filled 2023-07-03: qty 90, 90d supply, fill #2
  Filled 2023-09-27: qty 90, 90d supply, fill #3

## 2022-12-05 NOTE — Assessment & Plan Note (Signed)
Last colonoscopy 2022 Precancerous polyps removed Repeat in 58yrs

## 2022-12-05 NOTE — Patient Instructions (Addendum)
Go to lab Continue Heart healthy diet and daily exercise. Maintain current medications. Schedule appt for mammogram in September and annual eye exam

## 2022-12-05 NOTE — Assessment & Plan Note (Signed)
Repeat lipid panel ?

## 2022-12-05 NOTE — Progress Notes (Signed)
Complete physical exam  Patient: Tina Scott   DOB: [birth year]   57 y.o. Female  MRN: [id number] Visit Date: 12/05/2022  Subjective:    Chief Complaint  Patient presents with   Annual Exam    Fasting - Yes    Tina Scott is a 57 y.o. female who presents today for a complete physical exam. She reports consuming a general diet.  Limited due to knee injury  She generally feels well. She reports sleeping well. She does have additional problems to discuss today.  Vision:No Dental:Yes STD Screen:No  Wt Readings from Last 3 Encounters:  12/05/22 226 lb (102.5 kg)  09/07/22 220 lb (99.8 kg)  08/08/22 220 lb (99.8 kg)   Most recent fall risk assessment:    12/05/2022    8:25 AM  [place] in the past year? 0  Number falls in past yr: 0  Injury with Fall? 0  Risk for fall due to : No Fall Risks  Follow up Falls evaluation completed     Depression screen:Yes - No Depression  Most recent depression screenings:    08/08/2022    8:16 AM 07/06/2022    8:55 AM  PHQ 2/9 Scores  PHQ - 2 Score 2 2  PHQ- 9 Score 8 12    HPI  Hx of colonic polyps Last colonoscopy 2022 Precancerous polyps removed Repeat in 84yrs  Hyperparathyroidism ([place]) Under the care of endocrinology, has annual appts Dexa scan 2022: normal Repeat PTH, calcium and vit. 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    POLYPECTOMY     TUBAL  LIGATION     Social History   Socioeconomic History   Marital status: Married    Spouse name: Not on file   Number of children: 2   Years of education: Not on file   Highest education level: Not on file  Occupational History   Occupation: registered nurse    Employer: [employer]  Tobacco Use   Smoking status: Former    Packs/day: 0.25    Years: 10.00    Additional pack years: 0.00    Total pack years: 2.50    Types: Cigarettes    Quit date: 07/10/1990    Years since quitting: 32.4   Smokeless tobacco: Never  Vaping Use   Vaping Use: Never used  Substance and Sexual Activity   Alcohol use: Yes    Alcohol/week: 4.0 standard drinks of alcohol    Types: 2 Glasses of wine, 2 Cans of beer per week    Comment: occasionally   Drug use: Never   Sexual activity: Yes    Birth control/protection: Surgical  Other Topics Concern   Not on file  Social History Narrative   Not on file   Social Determinants of Health   Financial Resource Strain: Not on  file  Food Insecurity: Not on file  Transportation Needs: Not on file  Physical Activity: Not on file  Stress: Not on file  Social Connections: Not on file  Intimate Partner Violence: Not on file   Family Status  Relation Name Status   Mother E Alive   Father  Alive   MGM Ramie Deceased   PGF A (Not Specified)   Daughter D (Not Specified)   Neg Hx  (Not Specified)   Family History  Problem Relation Age of Onset   Colon polyps Mother    Hypertension Mother    Hyperlipidemia Mother    Osteoporosis Mother    Colon polyps Father    Colon cancer Maternal Grandmother    Cancer Maternal Grandmother 64       colon cancer   Kidney disease Paternal Grandfather    Depression Daughter    Esophageal cancer Neg Hx    Stomach cancer Neg Hx    Rectal cancer Neg Hx    Allergies  Allergen Reactions   Sulfa Antibiotics Rash    Rash with sun exposure.     Patient Care Team: , Charlene Brooke, NP as PCP - General (Internal  Medicine) Warren Danes, PA-C as Physician Assistant (Dermatology) [facility], Melanie Crazier, MD as Attending Physician (Endocrinology)   Medications: Outpatient Medications Prior to Visit  Medication Sig   amLODipine (NORVASC) 5 MG tablet Take 1 tablet (5 mg total) by mouth at bedtime.   Ascorbic Acid (VITAMIN C PO) Take by mouth.   b complex vitamins tablet Take 1 tablet by mouth daily.   benzonatate (TESSALON) 100 MG capsule Take 1 capsule (100 mg total) by mouth 3 (three) times daily as needed.   Cholecalciferol (VITAMIN D3 PO) Take 4,000 Int'l Units/day by mouth daily at 12 noon.   diclofenac Sodium (VOLTAREN) 1 % GEL Apply 4 g topically 4 (four) times daily. Apply to affected areas 4 times daily as needed for pain.   escitalopram (LEXAPRO) 10 MG tablet Take 1 tablet (10 mg total) by mouth daily.   fluticasone (FLONASE) 50 MCG/ACT nasal spray Place 2 sprays into both nostrils daily.   lidocaine (LIDODERM) 5 % Place 1 patch onto the skin daily. Remove & Discard patch within 12 hours or as directed by MD   Zinc Acetate, Oral, (ZINC ACETATE PO) Take by mouth.   [DISCONTINUED] lisinopril (ZESTRIL) 40 MG tablet Take 1 tablet (40 mg total) by mouth daily.   No facility-administered medications prior to visit.    Review of Systems  Constitutional:  Negative for activity change, appetite change and unexpected weight change.  Respiratory: Negative.    Cardiovascular: Negative.   Gastrointestinal: Negative.   Endocrine: Negative for cold intolerance and heat intolerance.  Genitourinary: Negative.   Musculoskeletal: Negative.   Skin: Negative.   Neurological: Negative.   Hematological: Negative.   Psychiatric/Behavioral:  Negative for behavioral problems, decreased concentration, dysphoric mood, hallucinations, self-injury, sleep disturbance and suicidal ideas. The patient is not nervous/anxious.         Objective:  BP 130/78 (BP Location: Left Arm, Patient Position: Sitting,  Cuff Size: Large)   Pulse 64   Temp 98.7 F (37.1 C) (Temporal)   Resp 16   Ht 5\' 4"  (1.626 m)   Wt 226 lb (102.5 kg)   SpO2 99%   BMI 38.79 kg/m     Physical Exam Vitals and nursing note reviewed.  Constitutional:      General: She is not in acute distress. HENT:  Right Ear: Tympanic membrane, ear canal and external ear normal.     Left Ear: Tympanic membrane, ear canal and external ear normal.     Nose: Nose normal.  Eyes:     Extraocular Movements: Extraocular movements intact.     Conjunctiva/sclera: Conjunctivae normal.     Pupils: Pupils are equal, round, and reactive to light.  Neck:     Thyroid: No thyroid mass, thyromegaly or thyroid tenderness.  Cardiovascular:     Rate and Rhythm: Normal rate and regular rhythm.     Pulses: Normal pulses.     Heart sounds: Normal heart sounds.  Pulmonary:     Effort: Pulmonary effort is normal.     Breath sounds: Normal breath sounds.  Abdominal:     General: Bowel sounds are normal.     Palpations: Abdomen is soft.  Musculoskeletal:        General: Normal range of motion.     Cervical back: Normal range of motion and neck supple.     Right lower leg: No edema.     Left lower leg: No edema.  Lymphadenopathy:     Cervical: No cervical adenopathy.  Skin:    General: Skin is warm and dry.  Neurological:     Mental Status: She is alert and oriented to person, place, and time.     Cranial Nerves: No cranial nerve deficit.  Psychiatric:        Mood and Affect: Mood normal.        Behavior: Behavior normal.        Thought Content: Thought content normal.      No results found for any visits on 12/05/22.    Assessment & Plan:    Routine Health Maintenance and Physical Exam  Immunization History  Administered Date(s) Administered   Hepatitis B, ADULT 01/15/2017, 02/16/2017, 06/28/2017   Hepatitis B, PED/ADOLESCENT 01/15/2017, 02/16/2017, 06/28/2017   Influenza,inj,Quad PF,6+ Mos 06/28/2017   Influenza,inj,Quad  PF,6-35 Mos 06/28/2018   Influenza-Unspecified 05/19/2016, 06/14/2020, 06/18/2021   MMR 01/15/2017, 02/16/2017   PFIZER Comirnaty(Gray Top)Covid-19 Tri-Sucrose Vaccine 10/10/2019, 10/31/2019, 08/20/2020   PPD Test 01/02/2017, 01/15/2017   Tdap 12/26/2016   Zoster Recombinat (Shingrix) 06/01/2021, 11/30/2021   Health Maintenance  Topic Date Due   INFLUENZA VACCINE  12/17/2022 (Originally 04/18/2022)   COVID-19 Vaccine (4 - 2023-24 season) 12/21/2022 (Originally 05/19/2022)   Hepatitis C Screening  12/05/2023 (Originally 05/02/1985)   HIV Screening  12/05/2023 (Originally 05/02/1982)   PAP SMEAR-Modifier  11/25/2023   MAMMOGRAM  05/24/2024   COLONOSCOPY (Pts 45-23yrs Insurance coverage will need to be confirmed)  10/20/2026   DTaP/Tdap/Td (2 - Td or Tdap) 12/27/2026   Zoster Vaccines- Shingrix  Completed   HPV VACCINES  Aged Out   Discussed health benefits of physical activity, and encouraged her to engage in regular exercise appropriate for her age and condition. Schedule appt for mammogram in September and annual eye exam.  Problem List Items Addressed This Visit       Cardiovascular and Mediastinum   Essential hypertension    BP at goal with amlodipine and lisnopril BP Readings from Last 3 Encounters:  12/05/22 130/78  09/07/22 122/80  08/08/22 127/77    Maintain med doses      Relevant Medications   lisinopril (ZESTRIL) 40 MG tablet     Endocrine   Hyperparathyroidism ([place])    Under the care of endocrinology, has annual appts Dexa scan 2022: normal Repeat PTH, calcium and vit. D today  Relevant Orders   PTH, intact (no Ca)   VITAMIN D 25 Hydroxy (Vit-D Deficiency, Fractures)     Other   Hx of colonic polyps    Last colonoscopy 2022 Precancerous polyps removed Repeat in 5yrs      Pure hypercholesterolemia    Repeat lipid panel      Relevant Medications   lisinopril (ZESTRIL) 40 MG tablet   Other Relevant Orders   Lipid panel   Vitamin D insufficiency    Relevant Orders   VITAMIN D 25 Hydroxy (Vit-D Deficiency, Fractures)   Other Visit Diagnoses     Encounter for preventative adult health care exam with abnormal findings    -  Primary   Relevant Orders   Comprehensive metabolic panel   Skin cancer screening       Relevant Orders   Ambulatory referral to Dermatology      Return in about 6 months (around 06/07/2023) for HTN, hyperlipidemia (fasting).     Wilfred Lacy, NP

## 2022-12-05 NOTE — Assessment & Plan Note (Signed)
BP at goal with amlodipine and lisnopril BP Readings from Last 3 Encounters:  12/05/22 130/78  09/07/22 122/80  08/08/22 127/77    Maintain med doses

## 2022-12-05 NOTE — Assessment & Plan Note (Signed)
Under the care of endocrinology, has annual appts Dexa scan 2022: normal Repeat PTH, calcium and vit. D today

## 2022-12-06 LAB — PARATHYROID HORMONE, INTACT (NO CA): PTH: 78 pg/mL — ABNORMAL HIGH (ref 16–77)

## 2022-12-07 NOTE — Progress Notes (Signed)
Abnormal: Elevated PTH and calcium: f/up with endocrinology Normal CMP and vit. D Abnormal lipid panel: elevated TC and LDL: need to maintain DASH or mediterranean diet and daily exercise

## 2022-12-12 ENCOUNTER — Other Ambulatory Visit (HOSPITAL_COMMUNITY): Payer: Self-pay

## 2022-12-12 ENCOUNTER — Encounter: Payer: Self-pay | Admitting: Family Medicine

## 2022-12-12 ENCOUNTER — Ambulatory Visit (INDEPENDENT_AMBULATORY_CARE_PROVIDER_SITE_OTHER): Payer: 59 | Admitting: Family Medicine

## 2022-12-12 VITALS — BP 130/88 | Ht 64.0 in | Wt 226.0 lb

## 2022-12-12 DIAGNOSIS — M1711 Unilateral primary osteoarthritis, right knee: Secondary | ICD-10-CM | POA: Diagnosis not present

## 2022-12-12 DIAGNOSIS — M23203 Derangement of unspecified medial meniscus due to old tear or injury, right knee: Secondary | ICD-10-CM | POA: Diagnosis not present

## 2022-12-12 MED ORDER — DIAZEPAM 5 MG PO TABS
5.0000 mg | ORAL_TABLET | Freq: Every day | ORAL | 0 refills | Status: DC
  Filled 2022-12-12: qty 2, 2d supply, fill #0

## 2022-12-12 NOTE — Patient Instructions (Signed)
Good to see you Please use ice as needed  We'll call you with the zilretta injection  We'll get the MRi at [facility]  We have sent a referral to physical therapy   Please send me a message in MyChart with any questions or updates.  Please see me back to perform the injection and setup a virtual visit once the MRi is resulted.   --Dr. Raeford Razor

## 2022-12-12 NOTE — Assessment & Plan Note (Signed)
Acute on chronic in nature.  She is having exacerbation of her underlying knee pain.  Previous x-rays were unrevealing.  She has been under greater than 6 weeks of physician directed home exercise therapy.  She has tried medications, bracing and physical therapy.  Has tried previous injections with limited success. -Counseled on home exercise therapy and supportive care. -Zilretta. -Referral to physical therapy. -MRI of the right knee to evaluate for internal derangement and for presurgical planning. -Valium for the MRI

## 2022-12-12 NOTE — Progress Notes (Signed)
  Tina Scott - 57 y.o. female MRN [id number]  Date of birth: 1966/01/22  SUBJECTIVE:  Including CC & ROS.  No chief complaint on file.   Tina Scott is a 57 y.o. female that is presenting with acute on chronic right knee pain.  The pain is occurring of the medial joint space.  The pain is keeping her up at night.  She denies any recent injury or inciting event..    Review of Systems See HPI   HISTORY: Past Medical, Surgical, Social, and Family History Reviewed & Updated per EMR.   Pertinent Historical Findings include:  Past Medical History:  Diagnosis Date   Adenomatous polyps    Anxiety 1986   Depression    Off and on for years   Hypertension    Nodulo-ulcerative basal cell carcinoma (BCC) 09/21/2021   Left Shoulder - anterior    Past Surgical History:  Procedure Laterality Date   CESAREAN SECTION     x2   COLONOSCOPY     HERNIA REPAIR     Hernia Revision also.    POLYPECTOMY     TUBAL LIGATION       PHYSICAL EXAM:  VS: BP 130/88 (BP Location: Left Arm, Patient Position: Sitting)   Ht 5\' 4"  (1.626 m)   Wt 226 lb (102.5 kg)   BMI 38.79 kg/m  Physical Exam Gen: NAD, alert, cooperative with exam, well-appearing MSK:  Right knee: Mild effusion. Limited range of motion. Instability with valgus and varus stress testing. Medial joint space tenderness. Positive McMurray's test Neurovascularly intact       ASSESSMENT & PLAN:   OA (osteoarthritis) of knee Acute on chronic in nature.  She is having exacerbation of her underlying knee pain.  Previous x-rays were unrevealing.  She has been under greater than 6 weeks of physician directed home exercise therapy.  She has tried medications, bracing and physical therapy.  Has tried previous injections with limited success. -Counseled on home exercise therapy and supportive care. -Zilretta. -Referral to physical therapy. -MRI of the right knee to evaluate for internal derangement and for presurgical  planning. -Valium for the MRI

## 2022-12-16 ENCOUNTER — Ambulatory Visit (INDEPENDENT_AMBULATORY_CARE_PROVIDER_SITE_OTHER): Payer: 59

## 2022-12-16 DIAGNOSIS — M1711 Unilateral primary osteoarthritis, right knee: Secondary | ICD-10-CM

## 2022-12-16 DIAGNOSIS — M25561 Pain in right knee: Secondary | ICD-10-CM | POA: Diagnosis not present

## 2022-12-18 ENCOUNTER — Telehealth: Payer: 59 | Admitting: *Deleted

## 2022-12-18 ENCOUNTER — Ambulatory Visit (INDEPENDENT_AMBULATORY_CARE_PROVIDER_SITE_OTHER): Payer: 59 | Admitting: Behavioral Health

## 2022-12-18 ENCOUNTER — Encounter: Payer: Self-pay | Admitting: Behavioral Health

## 2022-12-18 ENCOUNTER — Other Ambulatory Visit (HOSPITAL_BASED_OUTPATIENT_CLINIC_OR_DEPARTMENT_OTHER): Payer: Self-pay

## 2022-12-18 DIAGNOSIS — F411 Generalized anxiety disorder: Secondary | ICD-10-CM

## 2022-12-18 NOTE — Telephone Encounter (Signed)
Right knee Zilretta benefits verified. Pre-determination form and clinicals faxed to [employer].

## 2022-12-18 NOTE — Progress Notes (Signed)
[facility] Counselor/Therapist Progress Note  Patient ID: Tina Scott, MRN: [id number],    Date: 12/18/2022  Time Spent: 60 minutes   Treatment Type: Individual Therapy  Reported Symptoms: Anxiety   Mental Status Exam:  Appearance:   Casual     Behavior:  Appropriate and Sharing  Motor:  Normal  Speech/Language:   Clear and Coherent  Affect:  Appropriate and Congruent  Mood:  normal  Thought process:  normal  Thought content:    WNL  Sensory/Perceptual disturbances:    WNL  Orientation:  oriented to person  Attention:  Good  Concentration:  Good  Memory:  WNL  Fund of knowledge:   Good  Insight:    Good  Judgment:   Good  Impulse Control:  Good   Risk Assessment: Danger to Self:  No Self-injurious Behavior: No Danger to Others: No Duty to Warn:no Physical Aggression / Violence:No  Access to Firearms a concern: No  Gang Involvement:No   Subjective:   The patient reports since last being seen she has been doing "Pretty good". She reports utilizing coping strategies she has learned in therapy. She reports implementing coping mechanism of reframing her negative thoughts. The patient states "I am reframing my thoughts before I even walk through the doors of the hospital". She states she is attempting to allow herself to find balance between "What reality can be and perceived reality" to manage anticipatory anxiety. The patient states since utilizing this coping mechanism she does not have heart palpitations or dreading going to work. 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 The patient reports reframing her thoughts has allowed her to benefit and improve in other areas of her life because she is not worrying about going to work before she gets there and enjoying her day and not worrying about work after she goes. The patient states she has improved in her communication skills by allowing herself to identify what is in  her control and out of her control. She states having clarity has helped with managing her moods. She reports "I can walk thru life now and enjoy it".   She reports to be compliant with her medication regimen she denied experiencing any side effects. She has been taking her Lexapro medication for approximately six months. She states no interest in discontinuing her medication regimen at this time. She reports gratitude with her progress on her therapeutic goals. She reports she has been taking self help classes's to assist her with improving her leadership skills. She rated anxiety at a 2 today and depression at a 0 on a scale of 0 to 10 with 10 being severe. She identified coping strategies for herself as taking a walk, and stepping away from stressful situations to help her manage her mental health. The patient states interest with continuing therapy with [employer] for counseling and she states interest with continue her medication management. She presents with understanding to follow up with [facility] if needed. The patient denied SI/HI, AH/VH.  Counselor conducted check in. Counselor utilized opened ended questions, reflective listening and validated the patient. Counselor discussed the patients mental health symptoms and requested for the patient to rate her symptoms. Counselor questioned medication compliance. Counselor discussed coping strategies utilized and discussed the effectiveness of the coping mechanisms implemented. Counselor reviewed the patients therapeutic goals and discussed her progress with changing ineffective behaviors. Counselor discussed the patients interest with continuing to  receive therapy. Counselor assessed for SI/HI, AH/VH.      Interventions: Motivational Interviewing  Diagnosis:   ICD-10-CM   1. Anxiety state  F41.1       Plan:   Long Term Goal: Develop strategies to reduce symptoms. Short Term Goal: Reduce  anxiety and improve coping skills. Objective: Develop strategies for thought distraction when fixating on the future. Objective: Recognize and plan for anxiety provoking situations.    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 Short Term Goal: Learn and implement problem solving and conflict resolution. Objective: Learn and implement effective communication methods to reduce conflict.    Long Term Goal: Maintain stability of mood. Short Term Goal: Increase ability to manage moods. Objective: Achieve a level of symptom stability that will allow for the patient to discontinue psychotropic medication regimen at her interest.     Jannifer Hick, [facility]

## 2022-12-20 ENCOUNTER — Telehealth (INDEPENDENT_AMBULATORY_CARE_PROVIDER_SITE_OTHER): Payer: 59 | Admitting: Family Medicine

## 2022-12-20 ENCOUNTER — Encounter: Payer: Self-pay | Admitting: Family Medicine

## 2022-12-20 VITALS — Ht 64.0 in | Wt 226.0 lb

## 2022-12-20 DIAGNOSIS — M1711 Unilateral primary osteoarthritis, right knee: Secondary | ICD-10-CM | POA: Diagnosis not present

## 2022-12-20 NOTE — Progress Notes (Signed)
Virtual Visit via Video Note  I connected with Berneta Levins on 12/20/22 at  8:00 AM EDT by a video enabled telemedicine application and verified that I am speaking with the correct person using two identifiers.  Location: Patient:home Provider: office   I discussed the limitations of evaluation and management by telemedicine and the availability of in person appointments. The patient expressed understanding and agreed to proceed.  History of Present Illness:  Ms. Bracher is a 57 year old female that is following up after the MRI of the right knee.  This was demonstrating degenerative changes in the patellofemoral joint as well as a tear of the posterior horn of the medial meniscus closest to the ring.   Observations/Objective:   Assessment and Plan:  Osteoarthritis of right knee: Acute on chronic in nature.  MRI was demonstrating degenerative changes of the patellofemoral joint as well as meniscus. -Counseled on home exercise therapy and supportive care. -Continue physical therapy. -Pursue gel injection. -Could consider bracing or Zilretta or PRP   Follow Up Instructions:    I discussed the assessment and treatment plan with the patient. The patient was provided an opportunity to ask questions and all were answered. The patient agreed with the plan and demonstrated an understanding of the instructions.   The patient was advised to call back or seek an in-person evaluation if the symptoms worsen or if the condition fails to improve as anticipated.    Clearance Coots, MD

## 2022-12-20 NOTE — Assessment & Plan Note (Signed)
Acute on chronic in nature.  MRI was demonstrating degenerative changes of the patellofemoral joint as well as meniscus. -Counseled on home exercise therapy and supportive care. -Continue physical therapy. -Pursue gel injection. -Could consider bracing or Zilretta or PRP

## 2022-12-22 NOTE — Telephone Encounter (Addendum)
Completed PA form and clinicals faxed to [facility] for Monovisc gel injection.

## 2022-12-27 ENCOUNTER — Ambulatory Visit: Payer: 59

## 2022-12-28 NOTE — Therapy (Signed)
OUTPATIENT PHYSICAL THERAPY LOWER EXTREMITY EVALUATION   Patient Name: Tina Scott MRN: [id number] DOB:Oct 01, 1965, 57 y.o., female Today's Date: 12/29/2022  END OF SESSION:  PT End of Session - 12/29/22 1056     Visit Number 1    Date for PT Re-Evaluation 03/09/23    PT Start Time 1057    PT Stop Time 1130    PT Time Calculation (min) 33 min    Activity Tolerance Patient tolerated treatment well    Behavior During Therapy [facility] for tasks assessed/performed             Past Medical History:  Diagnosis Date   Adenomatous polyps    Anxiety 1986   Depression    Off and on for years   Hypertension    Nodulo-ulcerative basal cell carcinoma (BCC) 09/21/2021   Left Shoulder - anterior   Past Surgical History:  Procedure Laterality Date   CESAREAN SECTION     x2   COLONOSCOPY     HERNIA REPAIR     Hernia Revision also.    POLYPECTOMY     TUBAL LIGATION     Patient Active Problem List   Diagnosis Date Noted   GAD (generalized anxiety disorder) 07/06/2022   Peroneal tendinitis of left lower leg 06/14/2022   OA (osteoarthritis) of knee 05/15/2022   Vitamin D insufficiency 06/16/2021   Hyperparathyroidism 03/09/2021   Hypercalcemia 11/25/2020   Hx of basal cell carcinoma excision 11/24/2020   Pure hypercholesterolemia 05/25/2020   Hx of colonic polyps 05/25/2020   Carpal tunnel syndrome on right 01/02/2017   Essential hypertension 08/18/2016    PCP: Tina Ng, NP  REFERRING PROVIDER: Myra Rude, MD  REFERRING DIAG:  Diagnosis  M23.203 (ICD-10-CM) - Degenerative tear of medial meniscus of right knee    THERAPY DIAG:  Chronic pain of right knee  Muscle weakness (generalized)  Other lack of coordination  Difficulty in walking, not elsewhere classified  Rationale for Evaluation and Treatment: Rehabilitation  ONSET DATE: 12/12/22  SUBJECTIVE:   SUBJECTIVE STATEMENT: Patient reports that her Dr confirmed R meniscal tear with MRI,  also identified OA and MCL grade 1 sprain. He referred her to PT for strengthening, instructing her to focus on seated exercises.  PERTINENT HISTORY: Increasing R knee pain. Received injection 07/28/22.  Per referring physician Tina Scott is a 57 y.o. female that is presenting with acute on chronic right knee pain.  The pain is occurring of the medial joint space.  The pain is keeping her up at night.  She denies any recent injury or inciting event..   Right knee: Mild effusion. Limited range of motion. Instability with valgus and varus stress testing. Medial joint space tenderness. Positive McMurray's test Neurovascularly intact   PAIN:  Are you having pain? 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 Relieving factors: Lidocaine patches, Voltaren gel, brace. ice  PRECAUTIONS: None  WEIGHT BEARING RESTRICTIONS: No  FALLS:  Has patient fallen in last 6 months? 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  PLOF: Independent  PATIENT GOALS: Strengthen B hips and knees to tolerate additional exercise.  NEXT MD VISIT:   OBJECTIVE:   DIAGNOSTIC FINDINGS:  MRI 12/16/22 IMPRESSION: 1. Moderate patellofemoral and mild medial compartment  cartilage degenerative changes. 2. Tear of the 5 mm of the posterior horn of the medial meniscus closest to the root with mild extrusion of the posterior segment of the body of the medial meniscus. 3. Grade 1 sprain of the proximal medial collateral ligament. 4. Mild proximal patellar tendinosis. 5. Minimal edema within the superolateral aspect of Hoffa's fat pad. This can be seen with infrapatellar fat pad impingement.  COGNITION: Overall cognitive status: Within  functional limits for tasks assessed     SENSATION: WFL  EDEMA:  Patient reports fluctuating edema in B knees, R > L.  MUSCLE LENGTH: Hamstrings: 90 degrees B Thomas test: WNL  POSTURE: increased lumbar lordosis, anterior pelvic tilt, left pelvic obliquity, and weight shift left  PALPATION: TTP R knee ant/medial. TTP with patellar compression  LOWER EXTREMITY ROM: Patient demonstrates hypermobility in all LE joints   LOWER EXTREMITY MMT: WFL except where noted.  MMT Right eval Left eval  Hip flexion 4-   Hip extension 3+ 3+  Hip abduction 4 4  Hip adduction    Hip internal rotation 3+   Hip external rotation 3+   Knee flexion    Knee extension 4   Ankle dorsiflexion    Ankle plantarflexion    Ankle inversion    Ankle eversion       LOWER EXTREMITY SPECIAL TESTS:  Knee special tests: McMurray's test: positive  GAIT: Distance walked: In clinic distances Assistive device utilized: None Level of assistance: Complete Independence Comments: Patient walks [id number] steps at work on some days- wears knee brace at work.    TODAY'S TREATMENT:                                                                                                                              DATE:  12/29/22  POC, HEP  PATIENT EDUCATION:  Education details: POC, HEP Person educated: Patient Education method: Explanation Education comprehension: verbalized understanding  HOME EXERCISE PROGRAM: [id number]   ASSESSMENT:  CLINICAL IMPRESSION: Patient is a 57 y.o. who was seen today for physical therapy evaluation and treatment for R medial meniscus tear as well as OA of B knees, R > L. Patient was recently seen for PT to address knee pain. Sr wants her to strengthen all muscles as much as possible, preferably in NWB/seated position. Patient has previous HEP which does involve some standing, but limited knee flexion/ext in WB. She will start back with this program. She will benefit from PT to identify  the most apporopriate HEP going forward in order to decrease her pain and improve her tolerance in standing/walking including trunk and postural stability exercises. She also has access to an outdoor pool once the weather gets warmer and would like to learn about activities she can perform in the water as well.Plan is for gel injections.   OBJECTIVE IMPAIRMENTS: Abnormal gait, decreased activity tolerance, decreased balance, decreased coordination, difficulty walking, decreased ROM, decreased strength, improper body mechanics, postural dysfunction,  and pain.   ACTIVITY LIMITATIONS: lifting, bending, standing, squatting, and locomotion level  PARTICIPATION LIMITATIONS: shopping, community activity, and occupation  PERSONAL FACTORS: Past/current experiences are also affecting patient's functional outcome.   REHAB POTENTIAL: Good  CLINICAL DECISION MAKING: Stable/uncomplicated  EVALUATION COMPLEXITY: Moderate   GOALS: Goals reviewed with patient? Yes  SHORT TERM GOALS: Target date: 11/16/22 I with initial HEP Baseline: Goal status: INITIAL  LONG TERM GOALS: Target date: 03/09/23  I with final HEP Baseline:  Goal status: INITIAL  2.  Increase B LE strength to 5/5 Baseline:  Goal status: INITIAL  3.  Patient will return to her full work day with R knee pain < 3/10  Baseline: 5/10 Goal status: INITIAL  4.  Identify the most appropriate aquatic activities to facilitate LE and trunk stability. Baseline:  Goal status: INITIAL  5.  Patient will be able to return to her normal walking program, including hills, with knee pain < 3/10 Baseline: 5/10 Goal status: INITIAL  PLAN:  PT FREQUENCY: 1-2x/week  PT DURATION: 12 weeks  PLANNED INTERVENTIONS: Therapeutic exercises, Therapeutic activity, Neuromuscular re-education, Balance training, Gait training, Patient/Family education, Self Care, Joint mobilization, Stair training, Dry Needling, Electrical stimulation, Cryotherapy, Moist  heat, Vasopneumatic device, Ionotophoresis 4mg /ml Dexamethasone, and Manual therapy, aquatic therapy  PLAN FOR NEXT SESSION: Update HEP   Iona Beard, DPT 12/29/2022, 11:51 AM

## 2022-12-29 ENCOUNTER — Encounter: Payer: Self-pay | Admitting: Physical Therapy

## 2022-12-29 ENCOUNTER — Ambulatory Visit: Payer: 59 | Attending: Family Medicine | Admitting: Physical Therapy

## 2022-12-29 DIAGNOSIS — M1711 Unilateral primary osteoarthritis, right knee: Secondary | ICD-10-CM | POA: Diagnosis not present

## 2022-12-29 DIAGNOSIS — M25561 Pain in right knee: Secondary | ICD-10-CM | POA: Insufficient documentation

## 2022-12-29 DIAGNOSIS — R262 Difficulty in walking, not elsewhere classified: Secondary | ICD-10-CM | POA: Insufficient documentation

## 2022-12-29 DIAGNOSIS — G8929 Other chronic pain: Secondary | ICD-10-CM | POA: Insufficient documentation

## 2022-12-29 DIAGNOSIS — M6281 Muscle weakness (generalized): Secondary | ICD-10-CM | POA: Diagnosis not present

## 2022-12-29 DIAGNOSIS — R278 Other lack of coordination: Secondary | ICD-10-CM | POA: Insufficient documentation

## 2023-01-01 ENCOUNTER — Encounter: Payer: Self-pay | Admitting: *Deleted

## 2023-01-02 ENCOUNTER — Ambulatory Visit: Payer: 59

## 2023-01-02 DIAGNOSIS — R278 Other lack of coordination: Secondary | ICD-10-CM | POA: Diagnosis not present

## 2023-01-02 DIAGNOSIS — G8929 Other chronic pain: Secondary | ICD-10-CM

## 2023-01-02 DIAGNOSIS — R262 Difficulty in walking, not elsewhere classified: Secondary | ICD-10-CM | POA: Diagnosis not present

## 2023-01-02 DIAGNOSIS — M6281 Muscle weakness (generalized): Secondary | ICD-10-CM

## 2023-01-02 DIAGNOSIS — M25561 Pain in right knee: Secondary | ICD-10-CM | POA: Diagnosis not present

## 2023-01-02 DIAGNOSIS — M1711 Unilateral primary osteoarthritis, right knee: Secondary | ICD-10-CM | POA: Diagnosis not present

## 2023-01-02 NOTE — Therapy (Signed)
OUTPATIENT PHYSICAL THERAPY LOWER EXTREMITY TREATMENT   Patient Name: Tina Scott MRN: [id number] DOB:July 23, 1966, 57 y.o., female Today's Date: 01/02/2023  END OF SESSION:  PT End of Session - 01/02/23 1544     Visit Number 2    Date for PT Re-Evaluation 03/09/23    PT Start Time 1545    PT Stop Time 1630    PT Time Calculation (min) 45 min    Activity Tolerance Patient tolerated treatment well    Behavior During Therapy [facility] for tasks assessed/performed              Past Medical History:  Diagnosis Date   Adenomatous polyps    Anxiety 1986   Depression    Off and on for years   Hypertension    Nodulo-ulcerative basal cell carcinoma (BCC) 09/21/2021   Left Shoulder - anterior   Past Surgical History:  Procedure Laterality Date   CESAREAN SECTION     x2   COLONOSCOPY     HERNIA REPAIR     Hernia Revision also.    POLYPECTOMY     TUBAL LIGATION     Patient Active Problem List   Diagnosis Date Noted   GAD (generalized anxiety disorder) 07/06/2022   Peroneal tendinitis of left lower leg 06/14/2022   OA (osteoarthritis) of knee 05/15/2022   Vitamin D insufficiency 06/16/2021   Hyperparathyroidism 03/09/2021   Hypercalcemia 11/25/2020   Hx of basal cell carcinoma excision 11/24/2020   Pure hypercholesterolemia 05/25/2020   Hx of colonic polyps 05/25/2020   Carpal tunnel syndrome on right 01/02/2017   Essential hypertension 08/18/2016    PCP: Anne Ng, NP  REFERRING PROVIDER: Myra Rude, MD  REFERRING DIAG:  Diagnosis  M23.203 (ICD-10-CM) - Degenerative tear of medial meniscus of right knee    THERAPY DIAG:  Chronic pain of right knee  Muscle weakness (generalized)  Other lack of coordination  Difficulty in walking, not elsewhere classified  Rationale for Evaluation and Treatment: Rehabilitation  ONSET DATE: 12/12/22  SUBJECTIVE:   SUBJECTIVE STATEMENT: Pain still keeping me up and night. Worse at the end of the day.    PERTINENT HISTORY: Increasing R knee pain. Received injection 07/28/22.  Per referring physician Tina Scott is a 57 y.o. female that is presenting with acute on chronic right knee pain.  The pain is occurring of the medial joint space.  The pain is keeping her up at night.  She denies any recent injury or inciting event..   Right knee: Mild effusion. Limited range of motion. Instability with valgus and varus stress testing. Medial joint space tenderness. Positive McMurray's test Neurovascularly intact   PAIN:  Are you having pain? 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 Relieving factors: Lidocaine patches, Voltaren gel, brace. ice  PRECAUTIONS: None  WEIGHT BEARING RESTRICTIONS: No  FALLS:  Has patient fallen in last 6 months? 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  PLOF: Independent  PATIENT GOALS: Strengthen B hips and knees to tolerate additional exercise.  NEXT MD VISIT:   OBJECTIVE:   DIAGNOSTIC FINDINGS:  MRI 12/16/22 IMPRESSION: 1. Moderate patellofemoral and mild medial compartment cartilage degenerative changes. 2. Tear of the 5 mm of the posterior horn of the medial meniscus  closest to the root with mild extrusion of the posterior segment of the body of the medial meniscus. 3. Grade 1 sprain of the proximal medial collateral ligament. 4. Mild proximal patellar tendinosis. 5. Minimal edema within the superolateral aspect of Hoffa's fat pad. This can be seen with infrapatellar fat pad impingement.  COGNITION: Overall cognitive status: Within functional limits for tasks assessed     SENSATION: WFL  EDEMA:  Patient reports fluctuating edema in B knees, R > L.  MUSCLE  LENGTH: Hamstrings: 90 degrees B Thomas test: WNL  POSTURE: increased lumbar lordosis, anterior pelvic tilt, left pelvic obliquity, and weight shift left  PALPATION: TTP R knee ant/medial. TTP with patellar compression  LOWER EXTREMITY ROM: Patient demonstrates hypermobility in all LE joints   LOWER EXTREMITY MMT: WFL except where noted.  MMT Right eval Left eval  Hip flexion 4-   Hip extension 3+ 3+  Hip abduction 4 4  Hip adduction    Hip internal rotation 3+   Hip external rotation 3+   Knee flexion    Knee extension 4   Ankle dorsiflexion    Ankle plantarflexion    Ankle inversion    Ankle eversion       LOWER EXTREMITY SPECIAL TESTS:  Knee special tests: McMurray's test: positive  GAIT: Distance walked: In clinic distances Assistive device utilized: None Level of assistance: Complete Independence Comments: Patient walks 16109 steps at work on some days- wears knee brace at work.    TODAY'S TREATMENT:                                                                                                                              DATE:  01/02/23 Bike L3 x58mins  Leg ext 20# 2x10, 10# RLE x10 HS curls 35# 2x10, 20# RLE x10  Fitter pushes 2x10 2 blue bands  STS with yellow ball OHP 2x10 Heel taps 6" 20 reps alt, then standing on airex 20 reps  Ball squeezes 3s hold 2x10 Leg press 40# 2x10, 20# x10 RLE Lateral band walks green  Calf stretch 30s Calf raises 2x10  12/29/22  POC, HEP  PATIENT EDUCATION:  Education details: POC, HEP Person educated: Patient Education method: Explanation Education comprehension: verbalized understanding  HOME EXERCISE PROGRAM: [id number]   ASSESSMENT:  CLINICAL IMPRESSION: Patient returns with some pain in her R knee. Her pain is mostly with walking and standing a lot at work. We worked on strengthening around the knee joint to decrease pain and pressure through it. Does well with all interventions and isolated strengthening  today.   OBJECTIVE IMPAIRMENTS: Abnormal gait, decreased activity tolerance, decreased balance, decreased coordination, difficulty walking, decreased ROM, decreased strength, improper body mechanics, postural dysfunction, and pain.   ACTIVITY LIMITATIONS: lifting, bending, standing, squatting, and locomotion level  PARTICIPATION LIMITATIONS: shopping, community activity, and occupation  PERSONAL FACTORS: Past/current experiences are also affecting patient's functional outcome.   REHAB POTENTIAL: Good  CLINICAL DECISION MAKING: Stable/uncomplicated  EVALUATION COMPLEXITY: Moderate   GOALS: Goals reviewed with patient? Yes  SHORT TERM GOALS: Target date: 11/16/22 I with initial HEP Baseline: Goal status: MET  LONG TERM GOALS: Target date: 03/09/23  I with final HEP Baseline:  Goal status: INITIAL  2.  Increase B LE strength to 5/5 Baseline:  Goal status: INITIAL  3.  Patient will return to her full work day with R knee pain < 3/10  Baseline: 5/10 Goal status: INITIAL  4.  Identify the most appropriate aquatic activities to facilitate LE and trunk stability. Baseline:  Goal status: INITIAL  5.  Patient will be able to return to her normal walking program, including hills, with knee pain < 3/10 Baseline: 5/10 Goal status: INITIAL  PLAN:  PT FREQUENCY: 1-2x/week  PT DURATION: 12 weeks  PLANNED INTERVENTIONS: Therapeutic exercises, Therapeutic activity, Neuromuscular re-education, Balance training, Gait training, Patient/Family education, Self Care, Joint mobilization, Stair training, Dry Needling, Electrical stimulation, Cryotherapy, Moist heat, Vasopneumatic device, Ionotophoresis 4mg /ml Dexamethasone, and Manual therapy, aquatic therapy  PLAN FOR NEXT SESSION:   Iona Beard, DPT 01/02/2023, 4:45 PM

## 2023-01-10 ENCOUNTER — Ambulatory Visit: Payer: 59

## 2023-01-12 ENCOUNTER — Ambulatory Visit: Payer: 59 | Admitting: Physical Therapy

## 2023-01-12 DIAGNOSIS — M25561 Pain in right knee: Secondary | ICD-10-CM | POA: Diagnosis not present

## 2023-01-12 DIAGNOSIS — G8929 Other chronic pain: Secondary | ICD-10-CM

## 2023-01-12 DIAGNOSIS — M6281 Muscle weakness (generalized): Secondary | ICD-10-CM | POA: Diagnosis not present

## 2023-01-12 DIAGNOSIS — M1711 Unilateral primary osteoarthritis, right knee: Secondary | ICD-10-CM | POA: Diagnosis not present

## 2023-01-12 DIAGNOSIS — R262 Difficulty in walking, not elsewhere classified: Secondary | ICD-10-CM | POA: Diagnosis not present

## 2023-01-12 DIAGNOSIS — R278 Other lack of coordination: Secondary | ICD-10-CM | POA: Diagnosis not present

## 2023-01-12 NOTE — Therapy (Addendum)
OUTPATIENT PHYSICAL THERAPY LOWER EXTREMITY TREATMENT   Patient Name: Tina Scott MRN: [id number] DOB:[birth year], 57 y.o., female Today's Date: 01/12/2023  END OF SESSION:  PT End of Session - 01/12/23 0800     Visit Number 3    Date for PT Re-Evaluation 03/09/23    PT Start Time 0800    PT Stop Time 0845    PT Time Calculation (min) 45 min              Past Medical History:  Diagnosis Date   Adenomatous polyps    Anxiety 1986   Depression    Off and on for years   Hypertension    Nodulo-ulcerative basal cell carcinoma (BCC) 09/21/2021   Left Shoulder - anterior   Past Surgical History:  Procedure Laterality Date   CESAREAN SECTION     x2   COLONOSCOPY     HERNIA REPAIR     Hernia Revision also.    POLYPECTOMY     TUBAL LIGATION     Patient Active Problem List   Diagnosis Date Noted   GAD (generalized anxiety disorder) 07/06/2022   Peroneal tendinitis of left lower leg 06/14/2022   OA (osteoarthritis) of knee 05/15/2022   Vitamin D insufficiency 06/16/2021   Hyperparathyroidism (HCC) 03/09/2021   Hypercalcemia 11/25/2020   Hx of basal cell carcinoma excision 11/24/2020   Pure hypercholesterolemia 05/25/2020   Hx of colonic polyps 05/25/2020   Carpal tunnel syndrome on right 01/02/2017   Essential hypertension 08/18/2016    PCP: Anne Ng, NP  REFERRING PROVIDER: Myra Rude, MD  REFERRING DIAG:  Diagnosis  M23.203 (ICD-10-CM) - Degenerative tear of medial meniscus of right knee    THERAPY DIAG:  Chronic pain of right knee  Muscle weakness (generalized)  Rationale for Evaluation and Treatment: Rehabilitation  ONSET DATE: 12/12/22  SUBJECTIVE:   SUBJECTIVE STATEMENT: Definite 5/10 when I got up, better since I have been moving. Probably wont get better just need it to be stronger so I can function. Pt states doing HEP  PERTINENT HISTORY: Increasing R knee pain. Received injection 07/28/22.  Per referring  physician Tina Scott is a 57 y.o. female that is presenting with acute on chronic right knee pain.  The pain is occurring of the medial joint space.  The pain is keeping her up at night.  She denies any recent injury or inciting event..   Right knee: Mild effusion. Limited range of motion. Instability with valgus and varus stress testing. Medial joint space tenderness. Positive McMurray's test Neurovascularly intact   PAIN:  Are you having pain? 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 Relieving factors: Lidocaine patches, Voltaren gel, brace. ice  PRECAUTIONS: None  WEIGHT BEARING RESTRICTIONS: No  FALLS:  Has patient fallen in last 6 months? 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  PLOF: Independent  PATIENT GOALS: Strengthen B hips and knees to tolerate additional exercise.  NEXT MD VISIT:   OBJECTIVE:   DIAGNOSTIC FINDINGS:  MRI 12/16/22 IMPRESSION: 1. Moderate patellofemoral and mild medial compartment cartilage degenerative changes. 2. Tear of the 5 mm of the posterior horn of the medial meniscus closest to the root with mild extrusion of the posterior segment of the body of  the medial meniscus. 3. Grade 1 sprain of the proximal medial collateral ligament. 4. Mild proximal patellar tendinosis. 5. Minimal edema within the superolateral aspect of Hoffa's fat pad. This can be seen with infrapatellar fat pad impingement.  COGNITION: Overall cognitive status: Within functional limits for tasks assessed     SENSATION: WFL  EDEMA:  Patient reports fluctuating edema in B knees, R > L.  MUSCLE LENGTH: Hamstrings: 90 degrees B Thomas test: WNL  POSTURE: increased lumbar lordosis,  anterior pelvic tilt, left pelvic obliquity, and weight shift left  PALPATION: TTP R knee ant/medial. TTP with patellar compression  LOWER EXTREMITY ROM: Patient demonstrates hypermobility in all LE joints   LOWER EXTREMITY MMT: WFL except where noted.  MMT Right eval Left eval RT/Left 01/12/23  Hip flexion 4-  4+/4+  Hip extension 3+ 3+   Hip abduction 4 4   Hip adduction     Hip internal rotation 3+  4/4  Hip external rotation 3+  4/4  Knee flexion   4+/4+  Knee extension 4  4+/4+  Ankle dorsiflexion     Ankle plantarflexion     Ankle inversion     Ankle eversion        LOWER EXTREMITY SPECIAL TESTS:  Knee special tests: McMurray's test: positive  GAIT: Distance walked: In clinic distances Assistive device utilized: None Level of assistance: Complete Independence Comments: Patient walks [phone] steps at work on some days- wears knee brace at work.    TODAY'S TREATMENT:                                                                                                                              DATE:   01/12/23 Bike L 4 6 min Leg ext 25# 2x10, 10# RLE x10 HS curl 35# 2 sets 10 , RT 20# 10 x Leg Press 40# 3 set 12, feet 3 way for increased hip strength Leg Press Calf raises 40# 2 sets 15 Cable pulley BIL hip 4 way 12 x each 40# resisted gait with step up 5 x each leg forward and laterally 6 inch step down heel tap 2 sets 10  01/02/23 Bike L3 x50mins  Leg ext 20# 2x10, 10# RLE x10 Leg ext 20# 2x10, 10# RLE x10 Fitter pushes 2x10 2 blue bands  STS with yellow ball OHP 2x10 Heel taps 6" 20 reps alt, then standing on airex 20 reps  Ball squeezes 3s hold 2x10 Leg press 40# 2x10, 20# x10 RLE Lateral band walks green  Calf stretch 30s Calf raises 2x10  12/29/22  POC, HEP  PATIENT EDUCATION:  Education details: POC, HEP Person educated: Patient Education method: Explanation Education comprehension: verbalized understanding  HOME EXERCISE PROGRAM: [id number]    ASSESSMENT:  CLINICAL IMPRESSION: Patient returns with continued pain in her R knee. Pt states pain probably wont get better she just needs to be stronger to function in life, pt reports doing HEP. Continued to work on Print production planner  around the knee joint to decrease pain and pressure . Does well with all interventions and isolated strengthening today. Addressed goals and MMT as documented. MMT has increased from eval - pt states amount of pain effects strength  OBJECTIVE IMPAIRMENTS: Abnormal gait, decreased activity tolerance, decreased balance, decreased coordination, difficulty walking, decreased ROM, decreased strength, improper body mechanics, postural dysfunction, and pain.   ACTIVITY LIMITATIONS: lifting, bending, standing, squatting, and locomotion level  PARTICIPATION LIMITATIONS: shopping, community activity, and occupation  PERSONAL FACTORS: Past/current experiences are also affecting patient's functional outcome.   REHAB POTENTIAL: Good  CLINICAL DECISION MAKING: Stable/uncomplicated  EVALUATION COMPLEXITY: Moderate   GOALS: Goals reviewed with patient? Yes  SHORT TERM GOALS: Target date: 11/16/22 I with initial HEP Baseline: Goal status: MET  LONG TERM GOALS: Target date: 03/09/23  I with final HEP Baseline:  Goal status: on going 01/12/23  2.  Increase B LE strength to 5/5 Baseline:  Goal status: progressing 01/12/23  3.  Patient will return to her full work day with R knee pain < 3/10  Baseline: 5/10 Goal status: INITIAL  4.  Identify the most appropriate aquatic activities to facilitate LE and trunk stability. Baseline:  Goal status: INITIAL  5.  Patient will be able to return to her normal walking program, including hills, with knee pain < 3/10 Baseline: 5/10 Goal status: progressing 01/12/23  PLAN:  PT FREQUENCY: 1-2x/week  PT DURATION: 12 weeks  PLANNED INTERVENTIONS: Therapeutic exercises, Therapeutic activity, Neuromuscular re-education,  Balance training, Gait training, Patient/Family education, Self Care, Joint mobilization, Stair training, Dry Needling, Electrical stimulation, Cryotherapy, Moist heat, Vasopneumatic device, Ionotophoresis 4mg /ml Dexamethasone, and Manual therapy, aquatic therapy  PLAN FOR NEXT SESSION: update HEP   Patient Details  Name: DENELDA AKERLEY MRN: [id number] Date of Birth: [birth year] Referring Provider:  Anne Ng, NP  Encounter Date: 01/12/2023   Suanne Marker, PTA 01/12/2023, 8:00 AM  [place] [facility] [address] [place], [place], [address] Phone: [phone]   Fax:  [phone]

## 2023-01-15 NOTE — Telephone Encounter (Signed)
[employer] PA is authorized from 12/27/22 to 12/27/23. Auth # is [id number]. Patient owes 20% office visit copay and 20% product copay.  Left message for patient to call back.

## 2023-01-16 ENCOUNTER — Ambulatory Visit: Payer: 59 | Admitting: Physical Therapy

## 2023-01-18 ENCOUNTER — Ambulatory Visit: Payer: 59 | Attending: Family Medicine | Admitting: Physical Therapy

## 2023-01-18 DIAGNOSIS — M6281 Muscle weakness (generalized): Secondary | ICD-10-CM | POA: Diagnosis not present

## 2023-01-18 DIAGNOSIS — R262 Difficulty in walking, not elsewhere classified: Secondary | ICD-10-CM | POA: Diagnosis not present

## 2023-01-18 DIAGNOSIS — M25561 Pain in right knee: Secondary | ICD-10-CM | POA: Diagnosis not present

## 2023-01-18 DIAGNOSIS — G8929 Other chronic pain: Secondary | ICD-10-CM | POA: Diagnosis not present

## 2023-01-18 DIAGNOSIS — R278 Other lack of coordination: Secondary | ICD-10-CM | POA: Insufficient documentation

## 2023-01-18 NOTE — Therapy (Signed)
OUTPATIENT PHYSICAL THERAPY LOWER EXTREMITY TREATMENT   Patient Name: ALLYE HOYOS MRN: [id number] DOB:10-Mar-1966, 57 y.o., female Today's Date: 01/18/2023  END OF SESSION:  PT End of Session - 01/18/23 0759     Visit Number 4    Date for PT Re-Evaluation 03/09/23    PT Start Time 0800    PT Stop Time 0845    PT Time Calculation (min) 45 min              Past Medical History:  Diagnosis Date   Adenomatous polyps    Anxiety 1986   Depression    Off and on for years   Hypertension    Nodulo-ulcerative basal cell carcinoma (BCC) 09/21/2021   Left Shoulder - anterior   Past Surgical History:  Procedure Laterality Date   CESAREAN SECTION     x2   COLONOSCOPY     HERNIA REPAIR     Hernia Revision also.    POLYPECTOMY     TUBAL LIGATION     Patient Active Problem List   Diagnosis Date Noted   GAD (generalized anxiety disorder) 07/06/2022   Peroneal tendinitis of left lower leg 06/14/2022   OA (osteoarthritis) of knee 05/15/2022   Vitamin D insufficiency 06/16/2021   Hyperparathyroidism (HCC) 03/09/2021   Hypercalcemia 11/25/2020   Hx of basal cell carcinoma excision 11/24/2020   Pure hypercholesterolemia 05/25/2020   Hx of colonic polyps 05/25/2020   Carpal tunnel syndrome on right 01/02/2017   Essential hypertension 08/18/2016    PCP: Anne Ng, NP  REFERRING PROVIDER: Myra Rude, MD  REFERRING DIAG:  Diagnosis  M23.203 (ICD-10-CM) - Degenerative tear of medial meniscus of right knee    THERAPY DIAG:  Chronic pain of right knee  Muscle weakness (generalized)  Difficulty in walking, not elsewhere classified  Rationale for Evaluation and Treatment: Rehabilitation  ONSET DATE: 12/12/22  SUBJECTIVE:   SUBJECTIVE STATEMENT: amb in with a limp and lateral sway. Pt states once she gets warmed up better and as long as she stays moving good, but after working drive home and just awful. Sleep is not great with pain PERTINENT  HISTORY: Increasing R knee pain. Received injection 07/28/22.  Per referring physician AILEANA HODDER is a 57 y.o. female that is presenting with acute on chronic right knee pain.  The pain is occurring of the medial joint space.  The pain is keeping her up at night.  She denies any recent injury or inciting event..   Right knee: Mild effusion. Limited range of motion. Instability with valgus and varus stress testing. Medial joint space tenderness. Positive McMurray's test Neurovascularly intact   PAIN:  Are you having pain? 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 Relieving factors: Lidocaine patches, Voltaren gel, brace. ice  PRECAUTIONS: None  WEIGHT BEARING RESTRICTIONS: No  FALLS:  Has patient fallen in last 6 months? 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  PLOF: Independent  PATIENT GOALS: Strengthen B hips and knees to tolerate additional exercise.  NEXT MD VISIT:   OBJECTIVE:   DIAGNOSTIC FINDINGS:  MRI 12/16/22 IMPRESSION: 1. Moderate patellofemoral and mild medial compartment cartilage degenerative changes. 2. Tear of the 5 mm of the posterior horn of the medial meniscus  closest to the root with mild extrusion of the posterior segment of the body of the medial meniscus. 3. Grade 1 sprain of the proximal medial collateral ligament. 4. Mild proximal patellar tendinosis. 5. Minimal edema within the superolateral aspect of Hoffa's fat pad. This can be seen with infrapatellar fat pad impingement.  COGNITION: Overall cognitive status: Within functional limits for tasks assessed     SENSATION: WFL  EDEMA:  Patient reports fluctuating edema in B knees, R > L.  MUSCLE  LENGTH: Hamstrings: 90 degrees B Thomas test: WNL  POSTURE: increased lumbar lordosis, anterior pelvic tilt, left pelvic obliquity, and weight shift left  PALPATION: TTP R knee ant/medial. TTP with patellar compression  LOWER EXTREMITY ROM: Patient demonstrates hypermobility in all LE joints   LOWER EXTREMITY MMT: WFL except where noted.  MMT Right eval Left eval RT/Left 01/12/23 RT/Lt 01/18/23  Hip flexion 4-  4+/4+ 4+  Hip extension 3+ 3+    Hip abduction 4 4    Hip adduction      Hip internal rotation 3+  4/4 4  Hip external rotation 3+  4/4 4  Knee flexion   4+/4+ 4+  Knee extension 4  4+/4+ 4+  Ankle dorsiflexion      Ankle plantarflexion      Ankle inversion      Ankle eversion         LOWER EXTREMITY SPECIAL TESTS:  Knee special tests: McMurray's test: positive  GAIT: Distance walked: In clinic distances Assistive device utilized: None Level of assistance: Complete Independence Comments: Patient walks [id number] steps at work on some days- wears knee brace at work.    TODAY'S TREATMENT:                                                                                                                              DATE:   01/18/23 Bike L 4 6 min Leg Press 40# 3 set 15, feet 3 way for increased hip strength Leg Press Calf raises 40# 2 sets 15 BOSU step up fwd with opp leg ext 15x then laterally with opp hip abd 5# hip IR and ER 2 sets 10 BIL sitting STS with ball btwn knees for ADD work 15 x on airex Leg ext 25# 2x10, 10# RLE x10 HS curl 35# 2 sets 10 , RT 20# 10 x Standing 5# hip flex into knee ext 10 x BIL Standing 5 # hip flex,abd and ext without touching down 10 x each  01/12/23 Bike L 4 6 min Leg ext 25# 2x10, 10# RLE x10 HS curl 35# 2 sets 10 , RT 20# 10 x Leg Press 40# 3 set 12, feet 3 way for increased hip strength Leg Press Calf raises 40# 2 sets 15 Cable pulley BIL hip 4 way 12 x each 40# resisted gait with step up 5 x each leg forward and laterally 6  inch step down heel tap 2 sets 10  01/02/23 Bike  L3 x34mins  Leg ext 20# 2x10, 10# RLE x10 Leg ext 20# 2x10, 10# RLE x10 Fitter pushes 2x10 2 blue bands  STS with yellow ball OHP 2x10 Heel taps 6" 20 reps alt, then standing on airex 20 reps  Ball squeezes 3s hold 2x10 Leg press 40# 2x10, 20# x10 RLE Lateral band walks green  Calf stretch 30s Calf raises 2x10  12/29/22  POC, HEP  PATIENT EDUCATION:  Education details: POC, HEP Person educated: Patient Education method: Explanation Education comprehension: verbalized understanding  HOME EXERCISE PROGRAM: [id number]   ASSESSMENT:  CLINICAL IMPRESSION: Patient returns with continued pain in her R knee. Amb with a limp and lateral sway with c/o lateral leg pain most likely due from compensated gait with RT knee pain. Pt states doing various ex at home- walking, knee ext and hs curl machine, tband abd, step ups and downs, added hip IR and ER with wts. Pt states pain is tolerable once warmed up and moving, issue is more when she stops. Goals assessd   OBJECTIVE IMPAIRMENTS: Abnormal gait, decreased activity tolerance, decreased balance, decreased coordination, difficulty walking, decreased ROM, decreased strength, improper body mechanics, postural dysfunction, and pain.   ACTIVITY LIMITATIONS: lifting, bending, standing, squatting, and locomotion level  PARTICIPATION LIMITATIONS: shopping, community activity, and occupation  PERSONAL FACTORS: Past/current experiences are also affecting patient's functional outcome.   REHAB POTENTIAL: Good  CLINICAL DECISION MAKING: Stable/uncomplicated  EVALUATION COMPLEXITY: Moderate   GOALS: Goals reviewed with patient? Yes  SHORT TERM GOALS: Target date: 11/16/22 I with initial HEP Baseline: Goal status: MET  LONG TERM GOALS: Target date: 03/09/23  I with final HEP Baseline:  Goal status: on going 01/12/23  progressing 01/18/23  2.  Increase B LE strength to 5/5 Baseline:  Goal  status: progressing 01/12/23  3.  Patient will return to her full work day with R knee pain < 3/10  Baseline: 5/10 Goal status: progressing 01/18/23 back at work but pain 3-8/10  4.  Identify the most appropriate aquatic activities to facilitate LE and trunk stability. Baseline:  Goal status: INITIAL  5.  Patient will be able to return to her normal walking program, including hills, with knee pain < 3/10 Baseline: 5/10 Goal status: progressing 01/12/23 progressing 01/18/23  PLAN:  PT FREQUENCY: 1-2x/week  PT DURATION: 12 weeks  PLANNED INTERVENTIONS: Therapeutic exercises, Therapeutic activity, Neuromuscular re-education, Balance training, Gait training, Patient/Family education, Self Care, Joint mobilization, Stair training, Dry Needling, Electrical stimulation, Cryotherapy, Moist heat, Vasopneumatic device, Ionotophoresis 4mg /ml Dexamethasone, and Manual therapy, aquatic therapy  PLAN FOR NEXT SESSION: progress strength and func   Patient Details  Name: RAELIN PIXLER MRN: [id number] Date of Birth: 10/20/1965 Referring Provider:  Anne Ng, NP  Encounter Date: 01/18/2023   Suanne Marker, PTA 01/18/2023, 8:03 AM  [facility] [address] [place], [place], [address] Phone: [phone]   Fax:  [phone] [address] [place], [place], [address] Phone: [phone]   Fax:  [phone]

## 2023-01-22 ENCOUNTER — Encounter: Payer: Self-pay | Admitting: Physical Therapy

## 2023-01-22 ENCOUNTER — Ambulatory Visit: Payer: 59 | Admitting: Physical Therapy

## 2023-01-22 DIAGNOSIS — M25561 Pain in right knee: Secondary | ICD-10-CM | POA: Diagnosis not present

## 2023-01-22 DIAGNOSIS — R278 Other lack of coordination: Secondary | ICD-10-CM | POA: Diagnosis not present

## 2023-01-22 DIAGNOSIS — R262 Difficulty in walking, not elsewhere classified: Secondary | ICD-10-CM

## 2023-01-22 DIAGNOSIS — G8929 Other chronic pain: Secondary | ICD-10-CM | POA: Diagnosis not present

## 2023-01-22 DIAGNOSIS — M6281 Muscle weakness (generalized): Secondary | ICD-10-CM | POA: Diagnosis not present

## 2023-01-22 NOTE — Therapy (Signed)
OUTPATIENT PHYSICAL THERAPY LOWER EXTREMITY TREATMENT   Patient Name: Tina Scott MRN: [id number] DOB:October 04, 1965, 57 y.o., female Today's Date: 01/22/2023  END OF SESSION:  PT End of Session - 01/22/23 1018     Visit Number 5    Date for PT Re-Evaluation 03/09/23    PT Start Time 1015    PT Stop Time 1100    PT Time Calculation (min) 45 min    Activity Tolerance Patient tolerated treatment well    Behavior During Therapy [facility] for tasks assessed/performed              Past Medical History:  Diagnosis Date   Adenomatous polyps    Anxiety 1986   Depression    Off and on for years   Hypertension    Nodulo-ulcerative basal cell carcinoma (BCC) 09/21/2021   Left Shoulder - anterior   Past Surgical History:  Procedure Laterality Date   CESAREAN SECTION     x2   COLONOSCOPY     HERNIA REPAIR     Hernia Revision also.    POLYPECTOMY     TUBAL LIGATION     Patient Active Problem List   Diagnosis Date Noted   GAD (generalized anxiety disorder) 07/06/2022   Peroneal tendinitis of left lower leg 06/14/2022   OA (osteoarthritis) of knee 05/15/2022   Vitamin D insufficiency 06/16/2021   Hyperparathyroidism (HCC) 03/09/2021   Hypercalcemia 11/25/2020   Hx of basal cell carcinoma excision 11/24/2020   Pure hypercholesterolemia 05/25/2020   Hx of colonic polyps 05/25/2020   Carpal tunnel syndrome on right 01/02/2017   Essential hypertension 08/18/2016    PCP: Anne Ng, NP  REFERRING PROVIDER: Myra Rude, MD  REFERRING DIAG:  Diagnosis  M23.203 (ICD-10-CM) - Degenerative tear of medial meniscus of right knee    THERAPY DIAG:  Chronic pain of right knee  Muscle weakness (generalized)  Difficulty in walking, not elsewhere classified  Rationale for Evaluation and Treatment: Rehabilitation  ONSET DATE: 12/12/22  SUBJECTIVE:   SUBJECTIVE STATEMENT:  Feeling ok  PERTINENT HISTORY: Increasing R knee pain. Received injection 07/28/22.   Per referring physician RAELEI BOUSMAN is a 57 y.o. female that is presenting with acute on chronic right knee pain.  The pain is occurring of the medial joint space.  The pain is keeping her up at night.  She denies any recent injury or inciting event..   Right knee: Mild effusion. Limited range of motion. Instability with valgus and varus stress testing. Medial joint space tenderness. Positive McMurray's test Neurovascularly intact   PAIN:  Are you having pain? 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 Relieving factors: Lidocaine patches, Voltaren gel, brace. ice  PRECAUTIONS: None  WEIGHT BEARING RESTRICTIONS: No  FALLS:  Has patient fallen in last 6 months? 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  PLOF: Independent  PATIENT GOALS: Strengthen B hips and knees to tolerate additional exercise.  NEXT MD VISIT:   OBJECTIVE:   DIAGNOSTIC FINDINGS:  MRI 12/16/22 IMPRESSION: 1. Moderate patellofemoral and mild medial compartment cartilage degenerative changes. 2. Tear of the 5 mm of the posterior horn of the medial meniscus closest to the root with mild extrusion of the posterior segment of the body of  the medial meniscus. 3. Grade 1 sprain of the proximal medial collateral ligament. 4. Mild proximal patellar tendinosis. 5. Minimal edema within the superolateral aspect of Hoffa's fat pad. This can be seen with infrapatellar fat pad impingement.  COGNITION: Overall cognitive status: Within functional limits for tasks assessed     SENSATION: WFL  EDEMA:  Patient reports fluctuating edema in B knees, R > L.  MUSCLE LENGTH: Hamstrings: 90 degrees B Thomas test: WNL  POSTURE: increased  lumbar lordosis, anterior pelvic tilt, left pelvic obliquity, and weight shift left  PALPATION: TTP R knee ant/medial. TTP with patellar compression  LOWER EXTREMITY ROM: Patient demonstrates hypermobility in all LE joints   LOWER EXTREMITY MMT: WFL except where noted.  MMT Right eval Left eval RT/Left 01/12/23 RT/Lt 01/18/23  Hip flexion 4-  4+/4+ 4+  Hip extension 3+ 3+    Hip abduction 4 4    Hip adduction      Hip internal rotation 3+  4/4 4  Hip external rotation 3+  4/4 4  Knee flexion   4+/4+ 4+  Knee extension 4  4+/4+ 4+  Ankle dorsiflexion      Ankle plantarflexion      Ankle inversion      Ankle eversion         LOWER EXTREMITY SPECIAL TESTS:  Knee special tests: McMurray's test: positive  GAIT: Distance walked: In clinic distances Assistive device utilized: None Level of assistance: Complete Independence Comments: Patient walks 21308 steps at work on some days- wears knee brace at work.    TODAY'S TREATMENT:                                                                                                                              DATE:  01/22/23 Bike L 4 6 min Leg Press 40# 3 set 15 6in step ups 2x10 Resisted sides steps 40lb x 5 each Lateral Step ups 6in 2x10 Heel raises black bar 2x15 HS curls 35lb 2x15 Leg Ext 10lb 2x10    01/18/23 Bike L 4 6 min Leg Press 40# 3 set 15, feet 3 way for increased hip strength Leg Press Calf raises 40# 2 sets 15 BOSU step up fwd with opp leg ext 15x then laterally with opp hip abd 5# hip IR and ER 2 sets 10 BIL sitting STS with ball btwn knees for ADD work 15 x on airex Leg ext 25# 2x10, 10# RLE x10 HS curl 35# 2 sets 10 , RT 20# 10 x Standing 5# hip flex into knee ext 10 x BIL Standing 5 # hip flex,abd and ext without touching down 10 x each  01/12/23 Bike L 4 6 min Leg ext 25# 2x10, 10# RLE x10 HS curl 35# 2 sets 10 , RT 20# 10 x Leg Press 40# 3 set 12, feet 3 way for increased hip strength Leg Press Calf  raises 40# 2 sets 15 Cable pulley BIL hip 4 way  12 x each 40# resisted gait with step up 5 x each leg forward and laterally 6 inch step down heel tap 2 sets 10  01/02/23 Bike L3 x32mins  Leg ext 20# 2x10, 10# RLE x10 Leg ext 20# 2x10, 10# RLE x10 Fitter pushes 2x10 2 blue bands  STS with yellow ball OHP 2x10 Heel taps 6" 20 reps alt, then standing on airex 20 reps  Ball squeezes 3s hold 2x10 Leg press 40# 2x10, 20# x10 RLE Lateral band walks green  Calf stretch 30s Calf raises 2x10  12/29/22  POC, HEP  PATIENT EDUCATION:  Education details: POC, HEP Person educated: Patient Education method: Explanation Education comprehension: verbalized understanding  HOME EXERCISE PROGRAM: [id number]   ASSESSMENT:  CLINICAL IMPRESSION: Patient returns doing well. No issues completing today's interventions. Cues for full ROM needed on leg press. Come HS burning reported with seated curls.  Some hip weakness present with resisted sides steps.  OBJECTIVE IMPAIRMENTS: Abnormal gait, decreased activity tolerance, decreased balance, decreased coordination, difficulty walking, decreased ROM, decreased strength, improper body mechanics, postural dysfunction, and pain.   ACTIVITY LIMITATIONS: lifting, bending, standing, squatting, and locomotion level  PARTICIPATION LIMITATIONS: shopping, community activity, and occupation  PERSONAL FACTORS: Past/current experiences are also affecting patient's functional outcome.   REHAB POTENTIAL: Good  CLINICAL DECISION MAKING: Stable/uncomplicated  EVALUATION COMPLEXITY: Moderate   GOALS: Goals reviewed with patient? Yes  SHORT TERM GOALS: Target date: 11/16/22 I with initial HEP Baseline: Goal status: MET  LONG TERM GOALS: Target date: 03/09/23  I with final HEP Baseline:  Goal status: on going 01/12/23  progressing 01/18/23  2.  Increase B LE strength to 5/5 Baseline:  Goal status: progressing 01/12/23  3.  Patient will return to her full work  day with R knee pain < 3/10  Baseline: 5/10 Goal status: progressing 01/18/23 back at work but pain 3-8/10  4.  Identify the most appropriate aquatic activities to facilitate LE and trunk stability. Baseline:  Goal status: INITIAL  5.  Patient will be able to return to her normal walking program, including hills, with knee pain < 3/10 Baseline: 5/10 Goal status: progressing 01/12/23 progressing 01/18/23  PLAN:  PT FREQUENCY: 1-2x/week  PT DURATION: 12 weeks  PLANNED INTERVENTIONS: Therapeutic exercises, Therapeutic activity, Neuromuscular re-education, Balance training, Gait training, Patient/Family education, Self Care, Joint mobilization, Stair training, Dry Needling, Electrical stimulation, Cryotherapy, Moist heat, Vasopneumatic device, Ionotophoresis 4mg /ml Dexamethasone, and Manual therapy, aquatic therapy  PLAN FOR NEXT SESSION: progress strength and func   Patient Details  Name: Tina Scott MRN: [id number] Date of Birth: 12-09-1965 Referring Provider:  Anne Ng, NP  Encounter Date: 01/22/2023   Grayce Sessions, PTA 01/22/2023, 10:19 AM  [facility] [address] [place], [place], [address] Phone: [phone]   Fax:  [phone] [address] [place], [place], [address] Phone: [phone]   Fax:  [phone]

## 2023-01-23 NOTE — Telephone Encounter (Signed)
Pt informed of below.  Monovisc ordered.

## 2023-01-23 NOTE — Therapy (Signed)
OUTPATIENT PHYSICAL THERAPY LOWER EXTREMITY TREATMENT   Patient Name: Tina Scott MRN: [id number] DOB:Jul 04, 1966, 57 y.o., female Today's Date: 01/24/2023  END OF SESSION:  PT End of Session - 01/24/23 0801     Visit Number 6    Date for PT Re-Evaluation 03/09/23    PT Start Time 0800    PT Stop Time 0845    PT Time Calculation (min) 45 min    Activity Tolerance Patient tolerated treatment well    Behavior During Therapy [facility] for tasks assessed/performed               Past Medical History:  Diagnosis Date   Adenomatous polyps    Anxiety 1986   Depression    Off and on for years   Hypertension    Nodulo-ulcerative basal cell carcinoma (BCC) 09/21/2021   Left Shoulder - anterior   Past Surgical History:  Procedure Laterality Date   CESAREAN SECTION     x2   COLONOSCOPY     HERNIA REPAIR     Hernia Revision also.    POLYPECTOMY     TUBAL LIGATION     Patient Active Problem List   Diagnosis Date Noted   GAD (generalized anxiety disorder) 07/06/2022   Peroneal tendinitis of left lower leg 06/14/2022   OA (osteoarthritis) of knee 05/15/2022   Vitamin D insufficiency 06/16/2021   Hyperparathyroidism (HCC) 03/09/2021   Hypercalcemia 11/25/2020   Hx of basal cell carcinoma excision 11/24/2020   Pure hypercholesterolemia 05/25/2020   Hx of colonic polyps 05/25/2020   Carpal tunnel syndrome on right 01/02/2017   Essential hypertension 08/18/2016    PCP: Anne Ng, NP  REFERRING PROVIDER: Myra Rude, MD  REFERRING DIAG:  Diagnosis  M23.203 (ICD-10-CM) - Degenerative tear of medial meniscus of right knee    THERAPY DIAG:  Chronic pain of right knee  Muscle weakness (generalized)  Difficulty in walking, not elsewhere classified  Other lack of coordination  Rationale for Evaluation and Treatment: Rehabilitation  ONSET DATE: 12/12/22  SUBJECTIVE:   SUBJECTIVE STATEMENT:  Feeling fine, I have been walking around the house this  morning so my knees are warming up  PERTINENT HISTORY: Increasing R knee pain. Received injection 07/28/22.  Per referring physician MIDGIE ACAMPORA is a 57 y.o. female that is presenting with acute on chronic right knee pain.  The pain is occurring of the medial joint space.  The pain is keeping her up at night.  She denies any recent injury or inciting event..   Right knee: Mild effusion. Limited range of motion. Instability with valgus and varus stress testing. Medial joint space tenderness. Positive McMurray's test Neurovascularly intact   PAIN:  Are you having pain? 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 Relieving factors: Lidocaine patches, Voltaren gel, brace. ice  PRECAUTIONS: None  WEIGHT BEARING RESTRICTIONS: No  FALLS:  Has patient fallen in last 6 months? 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  PLOF: Independent  PATIENT GOALS: Strengthen B hips and knees to tolerate additional exercise.  NEXT MD VISIT:   OBJECTIVE:   DIAGNOSTIC FINDINGS:  MRI 12/16/22 IMPRESSION: 1. Moderate patellofemoral and mild medial compartment cartilage degenerative changes. 2. Tear of the 5 mm of the  posterior horn of the medial meniscus closest to the root with mild extrusion of the posterior segment of the body of the medial meniscus. 3. Grade 1 sprain of the proximal medial collateral ligament. 4. Mild proximal patellar tendinosis. 5. Minimal edema within the superolateral aspect of Hoffa's fat pad. This can be seen with infrapatellar fat pad impingement.  COGNITION: Overall cognitive status: Within functional limits for tasks assessed     SENSATION: WFL  EDEMA:  Patient reports  fluctuating edema in B knees, R > L.  MUSCLE LENGTH: Hamstrings: 90 degrees B Thomas test: WNL  POSTURE: increased lumbar lordosis, anterior pelvic tilt, left pelvic obliquity, and weight shift left  PALPATION: TTP R knee ant/medial. TTP with patellar compression  LOWER EXTREMITY ROM: Patient demonstrates hypermobility in all LE joints   LOWER EXTREMITY MMT: WFL except where noted.  MMT Right eval Left eval RT/Left 01/12/23 RT/Lt 01/18/23  Hip flexion 4-  4+/4+ 4+  Hip extension 3+ 3+    Hip abduction 4 4    Hip adduction      Hip internal rotation 3+  4/4 4  Hip external rotation 3+  4/4 4  Knee flexion   4+/4+ 4+  Knee extension 4  4+/4+ 4+  Ankle dorsiflexion      Ankle plantarflexion      Ankle inversion      Ankle eversion         LOWER EXTREMITY SPECIAL TESTS:  Knee special tests: McMurray's test: positive  GAIT: Distance walked: In clinic distances Assistive device utilized: None Level of assistance: Complete Independence Comments: Patient walks 16[address] steps at work on some days- wears knee brace at work.    TODAY'S TREATMENT:                                                                                                                              DATE:  01/24/23 Walk outdoors around back building  Stair practice in other building  Side steps on treadmill 30s each side Anterior and lateral heel taps on 6"- unable to touch heel to floor  3 way hip 5# 2x10 STS on airex with OHP 4# 2x10 HS curls 35# 2x12 Leg ext 10# 2x12    01/22/23 Bike L 4 6 min Leg Press 40# 3 set 15 6in step ups 2x10 Resisted sides steps 40lb x 5 each Lateral Step ups 6in 2x10 Heel raises black bar 2x15 HS curls 35lb 2x15 Leg Ext 10lb 2x10    01/18/23 Bike L 4 6 min Leg Press 40# 3 set 15, feet 3 way for increased hip strength Leg Press Calf raises 40# 2 sets 15 BOSU step up fwd with opp leg ext 15x then laterally with opp hip abd 5# hip IR and ER 2 sets 10 BIL sitting STS  with ball btwn knees for ADD work 15 x on airex Leg ext 25# 2x10, 10# RLE x10 HS curl 35# 2 sets 10 , RT  20# 10 x Standing 5# hip flex into knee ext 10 x BIL Standing 5 # hip flex,abd and ext without touching down 10 x each  01/12/23 Bike L 4 6 min Leg ext 25# 2x10, 10# RLE x10 HS curl 35# 2 sets 10 , RT 20# 10 x Leg Press 40# 3 set 12, feet 3 way for increased hip strength Leg Press Calf raises 40# 2 sets 15 Cable pulley BIL hip 4 way 12 x each 40# resisted gait with step up 5 x each leg forward and laterally 6 inch step down heel tap 2 sets 10  01/02/23 Bike L3 x77mins  Leg ext 20# 2x10, 10# RLE x10 Leg ext 20# 2x10, 10# RLE x10 Fitter pushes 2x10 2 blue bands  STS with yellow ball OHP 2x10 Heel taps 6" 20 reps alt, then standing on airex 20 reps  Ball squeezes 3s hold 2x10 Leg press 40# 2x10, 20# x10 RLE Lateral band walks green  Calf stretch 30s Calf raises 2x10  12/29/22  POC, HEP  PATIENT EDUCATION:  Education details: POC, HEP Person educated: Patient Education method: Explanation Education comprehension: verbalized understanding  HOME EXERCISE PROGRAM: [id number]   ASSESSMENT:  CLINICAL IMPRESSION: Patient returns doing well. Some pain with stair practice especially going downstairs, she typically does a step to pattern. Practiced some eccentric step downs, is unable to fully place foot on 6". Continued with strengthening.    OBJECTIVE IMPAIRMENTS: Abnormal gait, decreased activity tolerance, decreased balance, decreased coordination, difficulty walking, decreased ROM, decreased strength, improper body mechanics, postural dysfunction, and pain.   ACTIVITY LIMITATIONS: lifting, bending, standing, squatting, and locomotion level  PARTICIPATION LIMITATIONS: shopping, community activity, and occupation  PERSONAL FACTORS: Past/current experiences are also affecting patient's functional outcome.   REHAB POTENTIAL: Good  CLINICAL DECISION MAKING:  Stable/uncomplicated  EVALUATION COMPLEXITY: Moderate   GOALS: Goals reviewed with patient? Yes  SHORT TERM GOALS: Target date: 11/16/22 I with initial HEP Baseline: Goal status: MET  LONG TERM GOALS: Target date: 03/09/23  I with final HEP Baseline:  Goal status: on going 01/12/23  progressing 01/18/23  2.  Increase B LE strength to 5/5 Baseline:  Goal status: progressing 01/12/23  3.  Patient will return to her full work day with R knee pain < 3/10  Baseline: 5/10 Goal status: progressing 01/18/23 back at work but pain 3-8/10  4.  Identify the most appropriate aquatic activities to facilitate LE and trunk stability. Baseline:  Goal status: INITIAL  5.  Patient will be able to return to her normal walking program, including hills, with knee pain < 3/10 Baseline: 5/10 Goal status: progressing 01/12/23 progressing 01/18/23  PLAN:  PT FREQUENCY: 1-2x/week  PT DURATION: 12 weeks  PLANNED INTERVENTIONS: Therapeutic exercises, Therapeutic activity, Neuromuscular re-education, Balance training, Gait training, Patient/Family education, Self Care, Joint mobilization, Stair training, Dry Needling, Electrical stimulation, Cryotherapy, Moist heat, Vasopneumatic device, Ionotophoresis 4mg /ml Dexamethasone, and Manual therapy, aquatic therapy  PLAN FOR NEXT SESSION: progress strength and func   Patient Details  Name: Tina Scott MRN: [id number] Date of Birth: October 29, 1965 Referring Provider:  Anne Ng, NP  Encounter Date: 01/24/2023   Cassie Freer, PT 01/24/2023, 8:41 AM  [place] [facility] [address] [place], [place], [address] Phone: [phone]   Fax:  [phone] at [facility] [address] [place], [place], [address] Phone: [phone]   Fax:  [phone]

## 2023-01-24 ENCOUNTER — Ambulatory Visit: Payer: 59

## 2023-01-24 DIAGNOSIS — R278 Other lack of coordination: Secondary | ICD-10-CM | POA: Diagnosis not present

## 2023-01-24 DIAGNOSIS — R262 Difficulty in walking, not elsewhere classified: Secondary | ICD-10-CM | POA: Diagnosis not present

## 2023-01-24 DIAGNOSIS — M25561 Pain in right knee: Secondary | ICD-10-CM | POA: Diagnosis not present

## 2023-01-24 DIAGNOSIS — G8929 Other chronic pain: Secondary | ICD-10-CM

## 2023-01-24 DIAGNOSIS — M6281 Muscle weakness (generalized): Secondary | ICD-10-CM

## 2023-01-25 ENCOUNTER — Encounter: Payer: Self-pay | Admitting: Family Medicine

## 2023-01-25 ENCOUNTER — Other Ambulatory Visit: Payer: Self-pay

## 2023-01-25 ENCOUNTER — Ambulatory Visit (INDEPENDENT_AMBULATORY_CARE_PROVIDER_SITE_OTHER): Payer: 59 | Admitting: Family Medicine

## 2023-01-25 VITALS — Ht 64.0 in | Wt 226.0 lb

## 2023-01-25 DIAGNOSIS — M1711 Unilateral primary osteoarthritis, right knee: Secondary | ICD-10-CM | POA: Diagnosis not present

## 2023-01-25 MED ORDER — HYALURONAN 88 MG/4ML IX SOSY
88.0000 mg | PREFILLED_SYRINGE | Freq: Once | INTRA_ARTICULAR | Status: AC
  Administered 2023-01-25: 88 mg via INTRA_ARTICULAR

## 2023-01-25 NOTE — Patient Instructions (Signed)
Good to see you Please use ice as needed   Please send me a message in MyChart with any questions or updates.  Please see me back as needed.   --Dr.   

## 2023-01-25 NOTE — Progress Notes (Signed)
  Tina Scott - 57 y.o. female MRN [id number]  Date of birth: 03/05/1966  SUBJECTIVE:  Including CC & ROS.  No chief complaint on file.   Tina Scott is a 57 y.o. female that is  here for gel injection.    Review of Systems See HPI   HISTORY: Past Medical, Surgical, Social, and Family History Reviewed & Updated per EMR.   Pertinent Historical Findings include:  Past Medical History:  Diagnosis Date   Adenomatous polyps    Anxiety 1986   Depression    Off and on for years   Hypertension    Nodulo-ulcerative basal cell carcinoma (BCC) 09/21/2021   Left Shoulder - anterior    Past Surgical History:  Procedure Laterality Date   CESAREAN SECTION     x2   COLONOSCOPY     HERNIA REPAIR     Hernia Revision also.    POLYPECTOMY     TUBAL LIGATION       PHYSICAL EXAM:  VS: Ht 5\' 4"  (1.626 m)   Wt 226 lb (102.5 kg)   BMI 38.79 kg/m  Physical Exam Gen: NAD, alert, cooperative with exam, well-appearing MSK:  Right knee: Limited range of motion. Instability with valgus and varus stress testing. Patient is ambulatory Neurovascularly intact     Aspiration/Injection Procedure Note Tina Scott 1966-06-29  Procedure: Injection Indications: right knee pain  Procedure Details Consent: Risks of procedure as well as the alternatives and risks of each were explained to the (patient/caregiver).  Consent for procedure obtained. Time Out: Verified patient identification, verified procedure, site/side was marked, verified correct patient position, special equipment/implants available, medications/allergies/relevent history reviewed, required imaging and test results available.  Performed.  The area was cleaned with iodine and alcohol swabs.    The right knee superior lateral suprapatellar pouch was injected using 4 cc's of 1% lidocaine and 0.4 cc of 8.4% sodium bicarbonate with a 22 1 1/2" needle.  The syringe was switched and 4 mL of 22 mg/mL of Monovisc was  injected. Ultrasound was used. Images were obtained in  Long views showing the injection.    A sterile dressing was applied.  Patient did tolerate procedure well.    ASSESSMENT & PLAN:   OA (osteoarthritis) of knee Completed gel injection today.  -Pursue medial unloader brace due to thigh to calf ratio

## 2023-01-25 NOTE — Assessment & Plan Note (Addendum)
Completed gel injection today.  -Pursue medial unloader brace due to thigh to calf ratio

## 2023-02-01 ENCOUNTER — Ambulatory Visit: Payer: 59 | Admitting: Physical Therapy

## 2023-02-01 ENCOUNTER — Encounter: Payer: Self-pay | Admitting: Physical Therapy

## 2023-02-01 DIAGNOSIS — R262 Difficulty in walking, not elsewhere classified: Secondary | ICD-10-CM | POA: Diagnosis not present

## 2023-02-01 DIAGNOSIS — M6281 Muscle weakness (generalized): Secondary | ICD-10-CM

## 2023-02-01 DIAGNOSIS — G8929 Other chronic pain: Secondary | ICD-10-CM | POA: Diagnosis not present

## 2023-02-01 DIAGNOSIS — M25561 Pain in right knee: Secondary | ICD-10-CM | POA: Diagnosis not present

## 2023-02-01 DIAGNOSIS — R278 Other lack of coordination: Secondary | ICD-10-CM | POA: Diagnosis not present

## 2023-02-01 NOTE — Therapy (Signed)
OUTPATIENT PHYSICAL THERAPY LOWER EXTREMITY TREATMENT   Patient Name: Tina Scott MRN: [id number] DOB:1966/06/13, 57 y.o., female Today's Date: 02/01/2023  END OF SESSION:  PT End of Session - 02/01/23 0800     Visit Number 7    Date for PT Re-Evaluation 03/09/23    PT Start Time 0800    PT Stop Time 0845    PT Time Calculation (min) 45 min    Activity Tolerance Patient tolerated treatment well    Behavior During Therapy [facility] for tasks assessed/performed               Past Medical History:  Diagnosis Date   Adenomatous polyps    Anxiety 1986   Depression    Off and on for years   Hypertension    Nodulo-ulcerative basal cell carcinoma (BCC) 09/21/2021   Left Shoulder - anterior   Past Surgical History:  Procedure Laterality Date   CESAREAN SECTION     x2   COLONOSCOPY     HERNIA REPAIR     Hernia Revision also.    POLYPECTOMY     TUBAL LIGATION     Patient Active Problem List   Diagnosis Date Noted   GAD (generalized anxiety disorder) 07/06/2022   Peroneal tendinitis of left lower leg 06/14/2022   OA (osteoarthritis) of knee 05/15/2022   Vitamin D insufficiency 06/16/2021   Hyperparathyroidism (HCC) 03/09/2021   Hypercalcemia 11/25/2020   Hx of basal cell carcinoma excision 11/24/2020   Pure hypercholesterolemia 05/25/2020   Hx of colonic polyps 05/25/2020   Carpal tunnel syndrome on right 01/02/2017   Essential hypertension 08/18/2016    PCP: Anne Ng, NP  REFERRING PROVIDER: Myra Rude, MD  REFERRING DIAG:  Diagnosis  M23.203 (ICD-10-CM) - Degenerative tear of medial meniscus of right knee    THERAPY DIAG:  Chronic pain of right knee  Difficulty in walking, not elsewhere classified  Muscle weakness (generalized)  Rationale for Evaluation and Treatment: Rehabilitation  ONSET DATE: 12/12/22  SUBJECTIVE:   SUBJECTIVE STATEMENT:  Sleepy, gel injections was last week  PERTINENT HISTORY: Increasing R knee pain.  Received injection 07/28/22.  Per referring physician Tina Scott is a 57 y.o. female that is presenting with acute on chronic right knee pain.  The pain is occurring of the medial joint space.  The pain is keeping her up at night.  She denies any recent injury or inciting event..   Right knee: Mild effusion. Limited range of motion. Instability with valgus and varus stress testing. Medial joint space tenderness. Positive McMurray's test Neurovascularly intact   PAIN:  Are you having pain? 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 Relieving factors: Lidocaine patches, Voltaren gel, brace. ice  PRECAUTIONS: None  WEIGHT BEARING RESTRICTIONS: No  FALLS:  Has patient fallen in last 6 months? 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  PLOF: Independent  PATIENT GOALS: Strengthen B hips and knees to tolerate additional exercise.  NEXT MD VISIT:   OBJECTIVE:   DIAGNOSTIC FINDINGS:  MRI 12/16/22 IMPRESSION: 1. Moderate patellofemoral and mild medial compartment cartilage degenerative changes. 2. Tear of the 5 mm of the posterior horn of the medial meniscus closest to the root with mild extrusion of the posterior segment  of the body of the medial meniscus. 3. Grade 1 sprain of the proximal medial collateral ligament. 4. Mild proximal patellar tendinosis. 5. Minimal edema within the superolateral aspect of Hoffa's fat pad. This can be seen with infrapatellar fat pad impingement.  COGNITION: Overall cognitive status: Within functional limits for tasks assessed     SENSATION: WFL  EDEMA:  Patient reports fluctuating edema in B knees, R > L.  MUSCLE LENGTH: Hamstrings: 90 degrees B Thomas test:  WNL  POSTURE: increased lumbar lordosis, anterior pelvic tilt, left pelvic obliquity, and weight shift left  PALPATION: TTP R knee ant/medial. TTP with patellar compression  LOWER EXTREMITY ROM: Patient demonstrates hypermobility in all LE joints   LOWER EXTREMITY MMT: WFL except where noted.  MMT Right eval Left eval RT/Left 01/12/23 RT/Lt 01/18/23  Hip flexion 4-  4+/4+ 4+  Hip extension 3+ 3+    Hip abduction 4 4    Hip adduction      Hip internal rotation 3+  4/4 4  Hip external rotation 3+  4/4 4  Knee flexion   4+/4+ 4+  Knee extension 4  4+/4+ 4+  Ankle dorsiflexion      Ankle plantarflexion      Ankle inversion      Ankle eversion         LOWER EXTREMITY SPECIAL TESTS:  Knee special tests: McMurray's test: positive  GAIT: Distance walked: In clinic distances Assistive device utilized: None Level of assistance: Complete Independence Comments: Patient walks 16109 steps at work on some days- wears knee brace at work.    TODAY'S TREATMENT:                                                                                                                              DATE:  02/01/23 NuStep L5 x 6 min Forward & Lateral step ups 4in box on airex x10 each Eccentric step downs 4in x10 6 in x5  Heel raises black bar 2x15  HS curls 35# 2x15 Leg ext 10# 2x15 S2S 10lb 2x12 Leg press 50lb 2x12 Side steps on treadmill 1 min each  each side   01/24/23 Walk outdoors around back building  Stair practice in other building  Side steps on treadmill 30s each side Anterior and lateral heel taps on 6"- unable to touch heel to floor  3 way hip 5# 2x10 STS on airex with OHP 4# 2x10 HS curls 35# 2x12 Leg ext 10# 2x12    01/22/23 Bike L 4 6 min Leg Press 40# 3 set 15 6in step ups 2x10 Resisted sides steps 40lb x 5 each Lateral Step ups 6in 2x10 Heel raises black bar 2x15 HS curls 35lb 2x15 Leg Ext 10lb 2x10    01/18/23 Bike L 4 6 min Leg Press 40# 3 set 15, feet 3 way for  increased hip strength Leg Press Calf raises 40# 2 sets 15 BOSU step up fwd with opp leg ext 15x then  laterally with opp hip abd 5# hip IR and ER 2 sets 10 BIL sitting STS with ball btwn knees for ADD work 15 x on airex Leg ext 25# 2x10, 10# RLE x10 HS curl 35# 2 sets 10 , RT 20# 10 x Standing 5# hip flex into knee ext 10 x BIL Standing 5 # hip flex,abd and ext without touching down 10 x each  01/12/23 Bike L 4 6 min Leg ext 25# 2x10, 10# RLE x10 HS curl 35# 2 sets 10 , RT 20# 10 x Leg Press 40# 3 set 12, feet 3 way for increased hip strength Leg Press Calf raises 40# 2 sets 15 Cable pulley BIL hip 4 way 12 x each 40# resisted gait with step up 5 x each leg forward and laterally 6 inch step down heel tap 2 sets 10  01/02/23 Bike L3 x35mins  Leg ext 20# 2x10, 10# RLE x10 Leg ext 20# 2x10, 10# RLE x10 Fitter pushes 2x10 2 blue bands  STS with yellow ball OHP 2x10 Heel taps 6" 20 reps alt, then standing on airex 20 reps  Ball squeezes 3s hold 2x10 Leg press 40# 2x10, 20# x10 RLE Lateral band walks green  Calf stretch 30s Calf raises 2x10  12/29/22  POC, HEP  PATIENT EDUCATION:  Education details: POC, HEP Person educated: Patient Education method: Explanation Education comprehension: verbalized understanding  HOME EXERCISE PROGRAM: [id number]   ASSESSMENT:  CLINICAL IMPRESSION: Patient returns doing well, reports receiving gel injection last week. Some pain with eccentric step downs with 6 in box.  Increase reps and or ressitance tolerated with machine level interventions. Some instability noted with step ups using airex. Continued with strengthening.    OBJECTIVE IMPAIRMENTS: Abnormal gait, decreased activity tolerance, decreased balance, decreased coordination, difficulty walking, decreased ROM, decreased strength, improper body mechanics, postural dysfunction, and pain.   ACTIVITY LIMITATIONS: lifting, bending, standing, squatting, and locomotion level  PARTICIPATION  LIMITATIONS: shopping, community activity, and occupation  PERSONAL FACTORS: Past/current experiences are also affecting patient's functional outcome.   REHAB POTENTIAL: Good  CLINICAL DECISION MAKING: Stable/uncomplicated  EVALUATION COMPLEXITY: Moderate   GOALS: Goals reviewed with patient? Yes  SHORT TERM GOALS: Target date: 11/16/22 I with initial HEP Baseline: Goal status: MET  LONG TERM GOALS: Target date: 03/09/23  I with final HEP Baseline:  Goal status: on going 01/12/23  progressing 01/18/23  2.  Increase B LE strength to 5/5 Baseline:  Goal status: progressing 01/12/23  3.  Patient will return to her full work day with R knee pain < 3/10  Baseline: 5/10 Goal status: progressing 01/18/23 back at work but pain 3-8/10  4.  Identify the most appropriate aquatic activities to facilitate LE and trunk stability. Baseline:  Goal status: INITIAL  5.  Patient will be able to return to her normal walking program, including hills, with knee pain < 3/10 Baseline: 5/10 Goal status: progressing 01/12/23 progressing 01/18/23  PLAN:  PT FREQUENCY: 1-2x/week  PT DURATION: 12 weeks  PLANNED INTERVENTIONS: Therapeutic exercises, Therapeutic activity, Neuromuscular re-education, Balance training, Gait training, Patient/Family education, Self Care, Joint mobilization, Stair training, Dry Needling, Electrical stimulation, Cryotherapy, Moist heat, Vasopneumatic device, Ionotophoresis 4mg /ml Dexamethasone, and Manual therapy, aquatic therapy  PLAN FOR NEXT SESSION: progress strength and func   Patient Details  Name: AVELINE MURGIA MRN: [id number] Date of Birth: Feb 02, 1966 Referring Provider:  Anne Ng, NP  Encounter Date: 02/01/2023   Grayce Sessions, PTA 02/01/2023, 8:00 AM

## 2023-02-07 DIAGNOSIS — M1711 Unilateral primary osteoarthritis, right knee: Secondary | ICD-10-CM | POA: Diagnosis not present

## 2023-02-08 ENCOUNTER — Ambulatory Visit: Payer: 59 | Admitting: Physical Therapy

## 2023-02-08 DIAGNOSIS — M6281 Muscle weakness (generalized): Secondary | ICD-10-CM | POA: Diagnosis not present

## 2023-02-08 DIAGNOSIS — G8929 Other chronic pain: Secondary | ICD-10-CM

## 2023-02-08 DIAGNOSIS — R278 Other lack of coordination: Secondary | ICD-10-CM | POA: Diagnosis not present

## 2023-02-08 DIAGNOSIS — M25561 Pain in right knee: Secondary | ICD-10-CM | POA: Diagnosis not present

## 2023-02-08 DIAGNOSIS — R262 Difficulty in walking, not elsewhere classified: Secondary | ICD-10-CM | POA: Diagnosis not present

## 2023-02-08 NOTE — Therapy (Deleted)
[facility] at [facility] [address] [place], [place], [address] Phone: [phone]   Fax:  [phone]  Patient Details  Name: Tina Scott MRN: [id number] Date of Birth: 1966/04/28 Referring Provider:  Myra Rude, MD  Encounter Date: 02/08/2023   Suanne Marker, PTA 02/08/2023, 8:48 AM  [place] [facility] at [facility] [address] [place], [place], [address] Phone: [phone]   Fax:  [phone]

## 2023-02-08 NOTE — Therapy (Signed)
OUTPATIENT PHYSICAL THERAPY LOWER EXTREMITY TREATMENT   Patient Name: Tina Scott MRN: [id number] DOB:December 17, 1965, 57 y.o., female Today's Date: 02/08/2023  END OF SESSION:  PT End of Session - 02/08/23 0848     Visit Number 8    Date for PT Re-Evaluation 03/09/23    PT Start Time 0848    PT Stop Time 0930    PT Time Calculation (min) 42 min               Past Medical History:  Diagnosis Date   Adenomatous polyps    Anxiety 1986   Depression    Off and on for years   Hypertension    Nodulo-ulcerative basal cell carcinoma (BCC) 09/21/2021   Left Shoulder - anterior   Past Surgical History:  Procedure Laterality Date   CESAREAN SECTION     x2   COLONOSCOPY     HERNIA REPAIR     Hernia Revision also.    POLYPECTOMY     TUBAL LIGATION     Patient Active Problem List   Diagnosis Date Noted   GAD (generalized anxiety disorder) 07/06/2022   Peroneal tendinitis of left lower leg 06/14/2022   OA (osteoarthritis) of knee 05/15/2022   Vitamin D insufficiency 06/16/2021   Hyperparathyroidism (HCC) 03/09/2021   Hypercalcemia 11/25/2020   Hx of basal cell carcinoma excision 11/24/2020   Pure hypercholesterolemia 05/25/2020   Hx of colonic polyps 05/25/2020   Carpal tunnel syndrome on right 01/02/2017   Essential hypertension 08/18/2016    PCP: Anne Ng, NP  REFERRING PROVIDER: Myra Rude, MD  REFERRING DIAG:  Diagnosis  M23.203 (ICD-10-CM) - Degenerative tear of medial meniscus of right knee    THERAPY DIAG:  Chronic pain of right knee  Difficulty in walking, not elsewhere classified  Muscle weakness (generalized)  Rationale for Evaluation and Treatment: Rehabilitation  ONSET DATE: 12/12/22  SUBJECTIVE:   SUBJECTIVE STATEMENT:  inj 2 weeks ago, no major changes. PT is helping make it stronger but no change in pain. Steps are biggest issue especially going down  PERTINENT HISTORY: Increasing R knee pain. Received injection  07/28/22.  Per referring physician Tina Scott is a 57 y.o. female that is presenting with acute on chronic right knee pain.  The pain is occurring of the medial joint space.  The pain is keeping her up at night.  She denies any recent injury or inciting event..   Right knee: Mild effusion. Limited range of motion. Instability with valgus and varus stress testing. Medial joint space tenderness. Positive McMurray's test Neurovascularly intact   PAIN:  Are you having pain? 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 Relieving factors: Lidocaine patches, Voltaren gel, brace. ice  PRECAUTIONS: None  WEIGHT BEARING RESTRICTIONS: No  FALLS:  Has patient fallen in last 6 months? 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  PLOF: Independent  PATIENT GOALS: Strengthen B hips and knees to tolerate additional exercise.  NEXT MD VISIT:   OBJECTIVE:   DIAGNOSTIC FINDINGS:  MRI 12/16/22 IMPRESSION: 1. Moderate patellofemoral and mild medial compartment cartilage degenerative changes. 2. Tear of the 5 mm of the posterior horn of the medial meniscus closest to the root with mild extrusion of the posterior segment  of the body of the medial meniscus. 3. Grade 1 sprain of the proximal medial collateral ligament. 4. Mild proximal patellar tendinosis. 5. Minimal edema within the superolateral aspect of Hoffa's fat pad. This can be seen with infrapatellar fat pad impingement.  COGNITION: Overall cognitive status: Within functional limits for tasks assessed     SENSATION: WFL  EDEMA:  Patient reports fluctuating edema in B knees, R > L.  MUSCLE LENGTH: Hamstrings: 90 degrees B Thomas test: WNL  POSTURE:  increased lumbar lordosis, anterior pelvic tilt, left pelvic obliquity, and weight shift left  PALPATION: TTP R knee ant/medial. TTP with patellar compression  LOWER EXTREMITY ROM: Patient demonstrates hypermobility in all LE joints   LOWER EXTREMITY MMT: WFL except where noted.  MMT Right eval Left eval RT/Left 01/12/23 RT/Lt 01/18/23  Hip flexion 4-  4+/4+ 4+  Hip extension 3+ 3+    Hip abduction 4 4    Hip adduction      Hip internal rotation 3+  4/4 4  Hip external rotation 3+  4/4 4  Knee flexion   4+/4+ 4+  Knee extension 4  4+/4+ 4+  Ankle dorsiflexion      Ankle plantarflexion      Ankle inversion      Ankle eversion         LOWER EXTREMITY SPECIAL TESTS:  Knee special tests: McMurray's test: positive  GAIT: Distance walked: In clinic distances Assistive device utilized: None Level of assistance: Complete Independence Comments: Patient walks [id number] steps at work on some days- wears knee brace at work.    TODAY'S TREATMENT:                                                                                                                              DATE:   02/08/23 Nustep LE only L 6 6 min Knee ext 10# up with both ecc lowering SL 10 x each. 15# 10x 3 sec hold HS curls 35# 2 sets 15 Resisted gait with 6 inch step up 5 x each leg fwd then laterally 30# STS on airex with 10# 2 sets 10 Fitter 2 black push/pull 15x each Leg Press 80# 2 sets 12 BIL SL 40# 10 each 6 inch step up with opp leg ext 5# 10 x each the lateral with opp leg abd UE used to stab     02/01/23 NuStep L5 x 6 min Forward & Lateral step ups 4in box on airex x10 each Eccentric step downs 4in x10 6 in x5  Heel raises black bar 2x15  HS curls 35# 2x15 Leg ext 10# 2x15 S2S 10lb 2x12 Leg press 50lb 2x12 Side steps on treadmill 1 min each  each side   01/24/23 Walk outdoors around back building  Stair practice in other building  Side steps on treadmill 30s each side Anterior and lateral heel  taps on 6"- unable to touch heel to floor  3 way hip 5# 2x10 STS  on airex with OHP 4# 2x10 HS curls 35# 2x12 Leg ext 10# 2x12    01/22/23 Bike L 4 6 min Leg Press 40# 3 set 15 6in step ups 2x10 Resisted sides steps 40lb x 5 each Lateral Step ups 6in 2x10 Heel raises black bar 2x15 HS curls 35lb 2x15 Leg Ext 10lb 2x10    01/18/23 Bike L 4 6 min Leg Press 40# 3 set 15, feet 3 way for increased hip strength Leg Press Calf raises 40# 2 sets 15 BOSU step up fwd with opp leg ext 15x then laterally with opp hip abd 5# hip IR and ER 2 sets 10 BIL sitting STS with ball btwn knees for ADD work 15 x on airex Leg ext 25# 2x10, 10# RLE x10 HS curl 35# 2 sets 10 , RT 20# 10 x Standing 5# hip flex into knee ext 10 x BIL Standing 5 # hip flex,abd and ext without touching down 10 x each  01/12/23 Bike L 4 6 min Leg ext 25# 2x10, 10# RLE x10 HS curl 35# 2 sets 10 , RT 20# 10 x Leg Press 40# 3 set 12, feet 3 way for increased hip strength Leg Press Calf raises 40# 2 sets 15 Cable pulley BIL hip 4 way 12 x each 40# resisted gait with step up 5 x each leg forward and laterally 6 inch step down heel tap 2 sets 10  01/02/23 Bike L3 x27mins  Leg ext 20# 2x10, 10# RLE x10 Leg ext 20# 2x10, 10# RLE x10 Fitter pushes 2x10 2 blue bands  STS with yellow ball OHP 2x10 Heel taps 6" 20 reps alt, then standing on airex 20 reps  Ball squeezes 3s hold 2x10 Leg press 40# 2x10, 20# x10 RLE Lateral band walks green  Calf stretch 30s Calf raises 2x10  12/29/22  POC, HEP  PATIENT EDUCATION:  Education details: POC, HEP Person educated: Patient Education method: Explanation Education comprehension: verbalized understanding  HOME EXERCISE PROGRAM: [id number]   ASSESSMENT:  CLINICAL IMPRESSION: inj 2 weeks ago, no major changes. PT is helping make it stronger but no change in pain. Steps are biggest issue especially going down. Addressed goals. Progressed func strength with cuing for speed and  control with full ROM.  OBJECTIVE IMPAIRMENTS: Abnormal gait, decreased activity tolerance, decreased balance, decreased coordination, difficulty walking, decreased ROM, decreased strength, improper body mechanics, postural dysfunction, and pain.   ACTIVITY LIMITATIONS: lifting, bending, standing, squatting, and locomotion level  PARTICIPATION LIMITATIONS: shopping, community activity, and occupation  PERSONAL FACTORS: Past/current experiences are also affecting patient's functional outcome.   REHAB POTENTIAL: Good  CLINICAL DECISION MAKING: Stable/uncomplicated  EVALUATION COMPLEXITY: Moderate   GOALS: Goals reviewed with patient? Yes  SHORT TERM GOALS: Target date: 11/16/22 I with initial HEP Baseline: Goal status: MET  LONG TERM GOALS: Target date: 03/09/23  I with final HEP Baseline:  Goal status: on going 01/12/23  progressing 01/18/23  2.  Increase B LE strength to 5/5 Baseline:  Goal status: progressing 01/12/23  02/08/23 progressing 4+/5 func weakness noted with ex  3.  Patient will return to her full work day with R knee pain < 3/10  Baseline: 5/10 Goal status: progressing 01/18/23 back at work but pain 3-8/10  02/08/23 progressing   4.  Identify the most appropriate aquatic activities to facilitate LE and trunk stability. Baseline:  Goal status: INITIAL  5.  Patient will be able to return to her normal walking program, including hills, with knee pain < 3/10 Baseline:  5/10 Goal status: progressing 01/12/23 progressing 01/18/23  02/08/23 progressing walking but pain  PLAN:  PT FREQUENCY: 1-2x/week  PT DURATION: 12 weeks  PLANNED INTERVENTIONS: Therapeutic exercises, Therapeutic activity, Neuromuscular re-education, Balance training, Gait training, Patient/Family education, Self Care, Joint mobilization, Stair training, Dry Needling, Electrical stimulation, Cryotherapy, Moist heat, Vasopneumatic device, Ionotophoresis 4mg /ml Dexamethasone, and Manual therapy, aquatic  therapy  PLAN FOR NEXT SESSION: progress strength and func   Patient Details  Name: Tina Scott MRN: [id number] Date of Birth: Apr 24, 1966 Referring Provider:  Myra Rude, MD  Encounter Date: 02/08/2023   Suanne Marker, PTA 02/08/2023, 8:51 AM [facility] [address] [place], [place], [address] Phone: [phone]   Fax:  [phone]  Patient Details  Name: Tina Scott MRN: [id number] Date of Birth: September 22, 1965 Referring Provider:  Myra Rude, MD

## 2023-02-09 ENCOUNTER — Ambulatory Visit: Payer: 59 | Admitting: Physical Therapy

## 2023-02-15 ENCOUNTER — Encounter: Payer: Self-pay | Admitting: Physical Therapy

## 2023-02-15 ENCOUNTER — Ambulatory Visit: Payer: 59 | Admitting: Physical Therapy

## 2023-02-15 DIAGNOSIS — M25561 Pain in right knee: Secondary | ICD-10-CM | POA: Diagnosis not present

## 2023-02-15 DIAGNOSIS — M6281 Muscle weakness (generalized): Secondary | ICD-10-CM | POA: Diagnosis not present

## 2023-02-15 DIAGNOSIS — G8929 Other chronic pain: Secondary | ICD-10-CM | POA: Diagnosis not present

## 2023-02-15 DIAGNOSIS — R262 Difficulty in walking, not elsewhere classified: Secondary | ICD-10-CM

## 2023-02-15 DIAGNOSIS — R278 Other lack of coordination: Secondary | ICD-10-CM

## 2023-02-15 NOTE — Therapy (Signed)
OUTPATIENT PHYSICAL THERAPY LOWER EXTREMITY TREATMENT  PHYSICAL THERAPY DISCHARGE SUMMARY  Visits from Start of Care: 9  Current functional level related to goals / functional outcomes: Goals partially met   Remaining deficits: Continues to have chronic knee pain due to arthritic changes and meniscus tear.   Education / Equipment: HEP   Patient agrees to discharge. Patient goals were partially met. Patient is being discharged due to maximized rehab potential.    Patient Name: Tina Scott MRN: [id number] DOB:01-10-1966, 57 y.o., female Today's Date: 02/15/2023  END OF SESSION:  PT End of Session - 02/15/23 0759     Visit Number 9    Date for PT Re-Evaluation 03/09/23    PT Start Time 0758    PT Stop Time 0824    PT Time Calculation (min) 26 min    Activity Tolerance Patient tolerated treatment well    Behavior During Therapy [facility] for tasks assessed/performed               Past Medical History:  Diagnosis Date   Adenomatous polyps    Anxiety 1986   Depression    Off and on for years   Hypertension    Nodulo-ulcerative basal cell carcinoma (BCC) 09/21/2021   Left Shoulder - anterior   Past Surgical History:  Procedure Laterality Date   CESAREAN SECTION     x2   COLONOSCOPY     HERNIA REPAIR     Hernia Revision also.    POLYPECTOMY     TUBAL LIGATION     Patient Active Problem List   Diagnosis Date Noted   GAD (generalized anxiety disorder) 07/06/2022   Peroneal tendinitis of left lower leg 06/14/2022   OA (osteoarthritis) of knee 05/15/2022   Vitamin D insufficiency 06/16/2021   Hyperparathyroidism (HCC) 03/09/2021   Hypercalcemia 11/25/2020   Hx of basal cell carcinoma excision 11/24/2020   Pure hypercholesterolemia 05/25/2020   Hx of colonic polyps 05/25/2020   Carpal tunnel syndrome on right 01/02/2017   Essential hypertension 08/18/2016    PCP: Anne Ng, NP  REFERRING PROVIDER: Myra Rude, MD  REFERRING DIAG:   Diagnosis  M23.203 (ICD-10-CM) - Degenerative tear of medial meniscus of right knee    THERAPY DIAG:  Chronic pain of right knee  Difficulty in walking, not elsewhere classified  Muscle weakness (generalized)  Other lack of coordination  Rationale for Evaluation and Treatment: Rehabilitation  ONSET DATE: 12/12/22  SUBJECTIVE:   SUBJECTIVE STATEMENT:   Patient reports that her pain is still present. She knows it will not go away completely at this point, but feels she does have the tools to manage on her own.  PERTINENT HISTORY: Increasing R knee pain. Received injection 07/28/22.  Per referring physician Tina Scott is a 57 y.o. female that is presenting with acute on chronic right knee pain.  The pain is occurring of the medial joint space.  The pain is keeping her up at night.  She denies any recent injury or inciting event..   Right knee: Mild effusion. Limited range of motion. Instability with valgus and varus stress testing. Medial joint space tenderness. Positive McMurray's test Neurovascularly intact   PAIN:  Are you having pain? 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 Relieving factors: Lidocaine patches, Voltaren gel, brace. ice  PRECAUTIONS: None  WEIGHT BEARING RESTRICTIONS: No  FALLS:  Has patient fallen in last 6  months? 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  PLOF: Independent  PATIENT GOALS: Strengthen B hips and knees to tolerate additional exercise.  NEXT MD VISIT:   OBJECTIVE:   DIAGNOSTIC FINDINGS:  MRI 12/16/22 IMPRESSION: 1. Moderate patellofemoral and mild medial compartment cartilage degenerative changes. 2. Tear of the 5 mm of the posterior horn of the  medial meniscus closest to the root with mild extrusion of the posterior segment of the body of the medial meniscus. 3. Grade 1 sprain of the proximal medial collateral ligament. 4. Mild proximal patellar tendinosis. 5. Minimal edema within the superolateral aspect of Hoffa's fat pad. This can be seen with infrapatellar fat pad impingement.  COGNITION: Overall cognitive status: Within functional limits for tasks assessed     SENSATION: WFL  EDEMA:  Patient reports fluctuating edema in B knees, R > L.  MUSCLE LENGTH: Hamstrings: 90 degrees B Thomas test: WNL  POSTURE: increased lumbar lordosis, anterior pelvic tilt, left pelvic obliquity, and weight shift left  PALPATION: TTP R knee ant/medial. TTP with patellar compression  LOWER EXTREMITY ROM: Patient demonstrates hypermobility in all LE joints   LOWER EXTREMITY MMT: WFL except where noted.  MMT Right eval Left eval RT/Left 01/12/23 RT/Lt 01/18/23  Hip flexion 4-  4+/4+ 4+  Hip extension 3+ 3+    Hip abduction 4 4    Hip adduction      Hip internal rotation 3+  4/4 4  Hip external rotation 3+  4/4 4  Knee flexion   4+/4+ 4+  Knee extension 4  4+/4+ 4+  Ankle dorsiflexion      Ankle plantarflexion      Ankle inversion      Ankle eversion         LOWER EXTREMITY SPECIAL TESTS:  Knee special tests: McMurray's test: positive  GAIT: Distance walked: In clinic distances Assistive device utilized: None Level of assistance: Complete Independence Comments: Patient walks 29528 steps at work on some days- wears knee brace at work.    TODAY'S TREATMENT:                                                                                                                              DATE:  02/15/23 Bike L6 x 6 minutes   02/08/23 Nustep LE only L 6 6 min Knee ext 10# up with both ecc lowering SL 10 x each. 15# 10x 3 sec hold HS curls 35# 2 sets 15 Resisted gait with 6 inch step up 5 x each leg fwd then laterally 30# STS on  airex with 10# 2 sets 10 Fitter 2 black push/pull 15x each Leg Press 80# 2 sets 12 BIL SL 40# 10 each 6 inch step up with opp leg ext 5# 10 x each the lateral with opp leg abd  UE used to stab  02/01/23 NuStep L5 x 6 min Forward & Lateral step ups 4in box on airex x10 each Eccentric step downs 4in x10 6 in x5  Heel raises black bar 2x15  HS curls 35# 2x15 Leg ext 10# 2x15 S2S 10lb 2x12 Leg press 50lb 2x12 Side steps on treadmill 1 min each  each side   01/24/23 Walk outdoors around back building  Stair practice in other building  Side steps on treadmill 30s each side Anterior and lateral heel taps on 6"- unable to touch heel to floor  3 way hip 5# 2x10 STS on airex with OHP 4# 2x10 HS curls 35# 2x12 Leg ext 10# 2x12    01/22/23 Bike L 4 6 min Leg Press 40# 3 set 15 6in step ups 2x10 Resisted sides steps 40lb x 5 each Lateral Step ups 6in 2x10 Heel raises black bar 2x15 HS curls 35lb 2x15 Leg Ext 10lb 2x10    01/18/23 Bike L 4 6 min Leg Press 40# 3 set 15, feet 3 way for increased hip strength Leg Press Calf raises 40# 2 sets 15 BOSU step up fwd with opp leg ext 15x then laterally with opp hip abd 5# hip IR and ER 2 sets 10 BIL sitting STS with ball btwn knees for ADD work 15 x on airex Leg ext 25# 2x10, 10# RLE x10 HS curl 35# 2 sets 10 , RT 20# 10 x Standing 5# hip flex into knee ext 10 x BIL Standing 5 # hip flex,abd and ext without touching down 10 x each  01/12/23 Bike L 4 6 min Leg ext 25# 2x10, 10# RLE x10 HS curl 35# 2 sets 10 , RT 20# 10 x Leg Press 40# 3 set 12, feet 3 way for increased hip strength Leg Press Calf raises 40# 2 sets 15 Cable pulley BIL hip 4 way 12 x each 40# resisted gait with step up 5 x each leg forward and laterally 6 inch step down heel tap 2 sets 10  01/02/23 Bike L3 x7mins  Leg ext 20# 2x10, 10# RLE x10 Leg ext 20# 2x10, 10# RLE x10 Fitter pushes 2x10 2 blue bands  STS with yellow ball OHP 2x10 Heel taps 6" 20 reps alt, then  standing on airex 20 reps  Ball squeezes 3s hold 2x10 Leg press 40# 2x10, 20# x10 RLE Lateral band walks green  Calf stretch 30s Calf raises 2x10  12/29/22  POC, HEP  PATIENT EDUCATION:  Education details: POC, HEP Person educated: Patient Education method: Explanation Education comprehension: verbalized understanding  HOME EXERCISE PROGRAM: [id number]   ASSESSMENT:  CLINICAL IMPRESSION: inj 2 weeks ago, no major changes. PT is helping make it stronger but no change in pain. Steps are biggest issue especially going down. Addressed goals. Progressed func strength with cuing for speed and control with full ROM.  OBJECTIVE IMPAIRMENTS: Abnormal gait, decreased activity tolerance, decreased balance, decreased coordination, difficulty walking, decreased ROM, decreased strength, improper body mechanics, postural dysfunction, and pain.   ACTIVITY LIMITATIONS: lifting, bending, standing, squatting, and locomotion level  PARTICIPATION LIMITATIONS: shopping, community activity, and occupation  PERSONAL FACTORS: Past/current experiences are also affecting patient's functional outcome.   REHAB POTENTIAL: Good  CLINICAL DECISION MAKING: Stable/uncomplicated  EVALUATION COMPLEXITY: Moderate   GOALS: Goals reviewed with patient? Yes  SHORT TERM GOALS: Target date: 11/16/22 I with initial HEP Baseline: Goal status: MET  LONG TERM GOALS: Target date: 03/09/23  I with final HEP Baseline:  Goal status: 02/15/23-met  2.  Increase B LE strength to 5/5 Baseline:  Goal status: progressing 01/12/23  02/15/23 Not met, 4+/5 func weakness noted with ex  3.  Patient will return to her full work day with R knee pain < 3/10  Baseline: 5/10 Goal status: progressing 01/18/23 back at work but pain 3-8/10  02/15/23 Not met, up to 8/10, briefly after sitting for any period of time.  4.  Identify the most appropriate aquatic activities to facilitate LE and trunk stability. Baseline:  Goal status:  02/15/23- met  5.  Patient will be able to return to her normal walking program, including hills, with knee pain < 3/10 Baseline: 5/10 Goal status: progressing 01/12/23 progressing 01/18/23  02/15/23 Not met walking but pain  PLAN:  PT FREQUENCY: 1-2x/week  PT DURATION: 12 weeks  PLANNED INTERVENTIONS: Therapeutic exercises, Therapeutic activity, Neuromuscular re-education, Balance training, Gait training, Patient/Family education, Self Care, Joint mobilization, Stair training, Dry Needling, Electrical stimulation, Cryotherapy, Moist heat, Vasopneumatic device, Ionotophoresis 4mg /ml Dexamethasone, and Manual therapy, aquatic therapy  PLAN FOR NEXT SESSION: progress strength and func   Patient Details  Name: Tina Scott MRN: [id number] Date of Birth: [birth year] Referring Provider:  Myra Rude, MD  Oley Balm DPT 02/15/23 8:27 AM  [facility] at [facility] [address] [place], [place], [address] Phone: [phone]   Fax:  [phone]  Patient Details  Name: Tina Scott MRN: [id number] Date of Birth: Sep 07, 1966 Referring Provider:  Myra Rude, MD

## 2023-04-04 ENCOUNTER — Encounter: Payer: Self-pay | Admitting: Dermatology

## 2023-04-04 ENCOUNTER — Ambulatory Visit (INDEPENDENT_AMBULATORY_CARE_PROVIDER_SITE_OTHER): Payer: 59 | Admitting: Dermatology

## 2023-04-04 VITALS — BP 126/81

## 2023-04-04 DIAGNOSIS — D229 Melanocytic nevi, unspecified: Secondary | ICD-10-CM

## 2023-04-04 DIAGNOSIS — L821 Other seborrheic keratosis: Secondary | ICD-10-CM

## 2023-04-04 DIAGNOSIS — D1801 Hemangioma of skin and subcutaneous tissue: Secondary | ICD-10-CM

## 2023-04-04 DIAGNOSIS — W908XXA Exposure to other nonionizing radiation, initial encounter: Secondary | ICD-10-CM

## 2023-04-04 DIAGNOSIS — D2272 Melanocytic nevi of left lower limb, including hip: Secondary | ICD-10-CM | POA: Diagnosis not present

## 2023-04-04 DIAGNOSIS — Z85828 Personal history of other malignant neoplasm of skin: Secondary | ICD-10-CM | POA: Diagnosis not present

## 2023-04-04 DIAGNOSIS — Z1283 Encounter for screening for malignant neoplasm of skin: Secondary | ICD-10-CM

## 2023-04-04 DIAGNOSIS — L578 Other skin changes due to chronic exposure to nonionizing radiation: Secondary | ICD-10-CM | POA: Diagnosis not present

## 2023-04-04 DIAGNOSIS — Z808 Family history of malignant neoplasm of other organs or systems: Secondary | ICD-10-CM

## 2023-04-04 DIAGNOSIS — L814 Other melanin hyperpigmentation: Secondary | ICD-10-CM

## 2023-04-04 DIAGNOSIS — L905 Scar conditions and fibrosis of skin: Secondary | ICD-10-CM | POA: Diagnosis not present

## 2023-04-04 DIAGNOSIS — D692 Other nonthrombocytopenic purpura: Secondary | ICD-10-CM

## 2023-04-04 DIAGNOSIS — D239 Other benign neoplasm of skin, unspecified: Secondary | ICD-10-CM

## 2023-04-04 DIAGNOSIS — D492 Neoplasm of unspecified behavior of bone, soft tissue, and skin: Secondary | ICD-10-CM

## 2023-04-04 HISTORY — DX: Other benign neoplasm of skin, unspecified: D23.9

## 2023-04-04 NOTE — Patient Instructions (Addendum)
Thank you for visiting my office today. I appreciate your commitment to maintaining your skin health and am glad to assist you in this ongoing process.  Here are the key points from today's consultation:  - Skin Examination: We conducted a thorough examination from your scalp to your legs. We noted lentigines and mild actinic damage but no pre-cancer areas. Your previous basal cell carcinoma seems well-managed post-surgery.  - Observations and Recommendations:   - Cherry Angiomas: Identified on your chest, these are benign.   - Moles and Solar Lentigines: Noticed on shoulders; benign but will continue to monitor.   - Elmer Bales Macule: On your leg, showing a jagged border and size close to 6mm. Recommended removal for biopsy.  - Procedure for Macule Removal:   - Post-Procedure Care: Instructions include removing the bandage before showering, applying Vaseline or Aquaphor, and covering with a new bandage for two weeks, followed by ointment massage for another two weeks.  - Follow-Up:   - Biopsy Results: Will communicate via MyChart message for benign results or call directly for anything requiring further intervention.   - Sun Protection: Continue daily use of sunscreen, especially during prolonged outdoor activities.  Please feel free to reach out if you have any concerns or notice any changes in your skin condition. Looking forward to seeing you at your follow-up appointment or as needed.       Patient Handout: Wound Care for Skin Biopsy Site  Patient Handout: Wound Care for Skin Biopsy Site  Taking Care of Your Skin Biopsy Site  Proper care of the biopsy site is essential for promoting healing and minimizing scarring. This handout provides instructions on how to care for your biopsy site to ensure optimal recovery.  1. Cleaning the Wound:  Clean the biopsy site daily with gentle soap and water. Gently pat the area dry with a clean, soft towel. Avoid harsh scrubbing or rubbing  the area, as this can irritate the skin and delay healing.  2. Applying Aquaphor and Bandage:  After cleaning the wound, apply a thin layer of Aquaphor ointment to the biopsy site. Cover the area with a sterile bandage to protect it from dirt, bacteria, and friction. Change the bandage daily or as needed if it becomes soiled or wet.  3. Continued Care for One Week:  Repeat the cleaning, Aquaphor application, and bandaging process daily for one week following the biopsy procedure. Keeping the wound clean and moist during this initial healing period will help prevent infection and promote optimal healing.  4. Massaging Aquaphor into the Area:  ---After one week, discontinue the use of bandages but continue to apply Aquaphor to the biopsy site. ----Gently massage the Aquaphor into the area using circular motions. ---Massaging the skin helps to promote circulation and prevent the formation of scar tissue.   Additional Tips:  Avoid exposing the biopsy site to direct sunlight during the healing process, as this can cause hyperpigmentation or worsen scarring. If you experience any signs of infection, such as increased redness, swelling, warmth, or drainage from the wound, contact your healthcare provider immediately. Follow any additional instructions provided by your healthcare provider for caring for the biopsy site and managing any discomfort. Conclusion:  Taking proper care of your skin biopsy site is crucial for ensuring optimal healing and minimizing scarring. By following these instructions for cleaning, applying Aquaphor, and massaging the area, you can promote a smooth and successful recovery. If you have any questions or concerns about caring for your biopsy site,  don't hesitate to contact your healthcare provider for guidance.     Skin Education :   I counseled the patient regarding the following: Sun screen (SPF 30 or greater) should be applied during peak UV exposure (between  10am and 2pm) and reapplied after exercise or swimming.  The ABCDEs of melanoma were reviewed with the patient, and the importance of monthly self-examination of moles was emphasized. Should any moles change in shape or color, or itch, bleed or burn, pt will contact our office for evaluation sooner then their interval appointment.  Plan: Sunscreen Recommendations I recommended a broad spectrum sunscreen with a SPF of 30 or higher. I explained that SPF 30 sunscreens block approximately 97 percent of the sun's harmful rays. Sunscreens should be applied at least 15 minutes prior to expected sun exposure and then every 2 hours after that as long as sun exposure continues. If swimming or exercising sunscreen should be reapplied every 45 minutes to an hour after getting wet or sweating. One ounce, or the equivalent of a shot glass full of sunscreen, is adequate to protect the skin not covered by a bathing suit. I also recommended a lip balm with a sunscreen as well. Sun protective clothing can be used in lieu of sunscreen but must be worn the entire time you are exposed to the sun's rays.     Due to recent changes in healthcare laws, you may see results of your pathology and/or laboratory studies on MyChart before the doctors have had a chance to review them. We understand that in some cases there may be results that are confusing or concerning to you. Please understand that not all results are received at the same time and often the doctors may need to interpret multiple results in order to provide you with the best plan of care or course of treatment. Therefore, we ask that you please give [place] 2 business days to thoroughly review all your results before contacting the office for clarification. Should we see a critical lab result, you will be contacted sooner.  I counseled the patient regarding the following: Skin Care: Actinic Damage can improve with broad spectrum sunscreen, sun avoidance, bleaching creams,  retinoids, chemical peels and laser. Expectations: Actinic Damage is photo-aging from excessive sun exposure. It manifests as unwanted pigmentation, wrinkles and textural thinning of the skin.  I recommended the following: Broad Spectrum Sunscreen SPF 30+ - SPF 30 daily to face, neck, chest and hands, reapplying every 3 hours when outside for long periods of time  If You Need Anything After Your Visit  If you have any questions or concerns for your doctor, please call our main line at [phone] If no one answers, please leave a voicemail as directed and we will return your call as soon as possible. Messages left after 4 pm will be answered the following business day.   You may also send [place] a message via MyChart. We typically respond to MyChart messages within 1-2 business days.  For prescription refills, please ask your pharmacy to contact our office. Our fax number is [phone].  If you have an urgent issue when the clinic is closed that cannot wait until the next business day, you can page your doctor at the number below.    Please note that while we do our best to be available for urgent issues outside of office hours, we are not available 24/7.   If you have an urgent issue and are unable to reach [place], you may choose  to seek medical care at your doctor's office, retail clinic, urgent care center, or emergency room.  If you have a medical emergency, please immediately call 911 or go to the emergency department. In the event of inclement weather, please call our main line at [phone] for an update on the status of any delays or closures.  Dermatology Medication Tips: Please keep the boxes that topical medications come in in order to help keep track of the instructions about where and how to use these. Pharmacies typically print the medication instructions only on the boxes and not directly on the medication tubes.   If your medication is too expensive, please contact our office at  [phone] or send [place] a message through MyChart.   We are unable to tell what your co-pay for medications will be in advance as this is different depending on your insurance coverage. However, we may be able to find a substitute medication at lower cost or fill out paperwork to get insurance to cover a needed medication.   If a prior authorization is required to get your medication covered by your insurance company, please allow [place] 1-2 business days to complete this process.  Drug prices often vary depending on where the prescription is filled and some pharmacies may offer cheaper prices.  The website www.goodrx.com contains coupons for medications through different pharmacies. The prices here do not account for what the cost may be with help from insurance (it may be cheaper with your insurance), but the website can give you the price if you did not use any insurance.  - You can print the associated coupon and take it with your prescription to the pharmacy.  - You may also stop by our office during regular business hours and pick up a GoodRx coupon card.  - If you need your prescription sent electronically to a different pharmacy, notify our office through [facility] or by phone at [phone]

## 2023-04-04 NOTE — Progress Notes (Signed)
New Patient Visit   Subjective  Tina Scott is a 57 y.o. female who presents for the following: Skin Cancer Screening and Full Body Skin Exam. She is a prior patient of Kelli Shefflied. Her last skin exam was a year ago. She has a history of BCC on the left shoulder treated with Mohs 01/2022 at [facility]. She says both her parents have a history of Melanoma. No specific concerns today.   The patient presents for Total-Body Skin Exam (TBSE) for skin cancer screening and mole check. The patient has spots, moles and lesions to be evaluated, some may be new or changing and the patient may have concern these could be cancer.    The following portions of the chart were reviewed this encounter and updated as appropriate: medications, allergies, medical history  Review of Systems:  No other skin or systemic complaints except as noted in HPI or Assessment and Plan.  Objective  Well appearing patient in no apparent distress; mood and affect are within normal limits.  A full examination was performed including scalp, head, eyes, ears, nose, lips, neck, chest, axillae, abdomen, back, buttocks, bilateral upper extremities, bilateral lower extremities, hands, feet, fingers, toes, fingernails, and toenails. All findings within normal limits unless otherwise noted below.   Relevant physical exam findings are noted in the Assessment and Plan.  Left Lower Leg - Posterior 4.5 mm irregular brown black nevus         Assessment & Plan   SKIN CANCER SCREENING PERFORMED TODAY.  ACTINIC DAMAGE-mild - Chronic condition, secondary to cumulative UV/sun exposure - diffuse scaly erythematous macules with underlying dyspigmentation - Recommend daily broad spectrum sunscreen SPF 30+ to sun-exposed areas, reapply every 2 hours as needed.  - Staying in the shade or wearing long sleeves, sun glasses (UVA+UVB protection) and wide brim hats (4-inch brim around the entire circumference of the  hat) are also recommended for sun protection.  - Call for new or changing lesions.  LENTIGINES, SEBORRHEIC KERATOSES, HEMANGIOMAS - Benign normal skin lesions - Benign-appearing - Call for any changes  MELANOCYTIC NEVI - Tan-brown and/or pink-flesh-colored symmetric macules and papules - Benign appearing on exam today - Observation - Call clinic for new or changing moles - Recommend daily use of broad spectrum spf 30+ sunscreen to sun-exposed areas.   Purpura - Chronic; persistent and recurrent.  Treatable, but not curable. - Violaceous macules and patches - Benign - Related to trauma, age, sun damage and/or use of blood thinners, chronic use of topical and/or oral steroids - Observe - Can use OTC arnica containing moisturizer such as Dermend Bruise Formula if desired - Call for worsening or other concerns       PROCEDURE NOTE  Neoplasm of skin Left Lower Leg - Posterior  Skin / nail biopsy Type of biopsy: tangential   Informed consent: discussed and consent obtained   Timeout: patient name, date of birth, surgical site, and procedure verified   Procedure prep:  Patient was prepped and draped in usual sterile fashion Prep type:  Isopropyl alcohol Anesthesia: the lesion was anesthetized in a standard fashion   Anesthetic:  1% lidocaine w/ epinephrine 1-100,000 buffered w/ 8.4% NaHCO3 Instrument used: DermaBlade   Hemostasis achieved with: aluminum chloride   Outcome: patient tolerated procedure well   Post-procedure details: sterile dressing applied and wound care instructions given   Dressing type: petrolatum gauze and bandage      Return in about 1 year (around 04/03/2024) for TBSE.  Jaclynn Guarneri, CMA, am acting as scribe for [employer], DO.   Documentation: I have reviewed the above documentation for accuracy and completeness, and I agree with the above.  Langston Reusing, DO

## 2023-04-10 NOTE — Progress Notes (Signed)
Hi Michele, Please call pt and notify that their bx results showed an abnormal mole that requires a shave excision in office with Dr Onalee Hua  We can schedule in a regular 15 min appointment slot.   -Thanks

## 2023-04-12 ENCOUNTER — Telehealth: Payer: Self-pay

## 2023-04-12 NOTE — Telephone Encounter (Signed)
-----   Message from Langston Reusing sent at 04/10/2023 12:32 PM EDT ----- Forde Dandy, Please call pt and notify that their bx results showed an abnormal mole that requires a shave excision in office with Dr Onalee Hua  We can schedule in a regular 15 min appointment slot.   -Thanks

## 2023-04-12 NOTE — Telephone Encounter (Signed)
I spoke to her, gave results and scheduled procedure

## 2023-04-30 ENCOUNTER — Ambulatory Visit (INDEPENDENT_AMBULATORY_CARE_PROVIDER_SITE_OTHER): Payer: 59 | Admitting: Family Medicine

## 2023-04-30 VITALS — BP 129/78 | Ht 64.0 in | Wt 230.0 lb

## 2023-04-30 DIAGNOSIS — G8929 Other chronic pain: Secondary | ICD-10-CM

## 2023-04-30 DIAGNOSIS — M25562 Pain in left knee: Secondary | ICD-10-CM

## 2023-04-30 MED ORDER — METHYLPREDNISOLONE ACETATE 40 MG/ML IJ SUSP
40.0000 mg | Freq: Once | INTRAMUSCULAR | Status: AC
  Administered 2023-04-30: 40 mg via INTRA_ARTICULAR

## 2023-04-30 NOTE — Patient Instructions (Signed)

## 2023-05-01 ENCOUNTER — Encounter: Payer: Self-pay | Admitting: Family Medicine

## 2023-05-01 NOTE — Progress Notes (Signed)
PCP: Anne Ng, NP  Subjective:   HPI: Patient is a 57 y.o. female here for left knee pain.  Patient reports worsening pain in her left knee for several weeks. No acute injury or trauma. Had been diagnosed with arthritis in the past though right knee was most recently treated including visco and brace which helped. Having trouble walking due to left knee pain. Pain is anterior. No catching, locking, giving out.  Past Medical History:  Diagnosis Date   Adenomatous polyps    Anxiety 1986   Depression    Off and on for years   Dysplastic nevus 04/04/2023   left post lower leg. 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 90 tablet 3   Ascorbic Acid (VITAMIN C PO) Take by mouth.     b complex vitamins tablet Take 1 tablet by mouth daily.     benzonatate (TESSALON) 100 MG capsule Take 1 capsule (100 mg total) by mouth 3 (three) times daily as needed. 30 capsule 0   Cholecalciferol (VITAMIN D3 PO) Take 4,000 Int'l Units/day by mouth daily at 12 noon.     diazepam (VALIUM) 5 MG tablet Take 1 tablet (5 mg total) by mouth 2 hours prior to surgery. 2 tablet 0   diclofenac Sodium (VOLTAREN) 1 % GEL Apply 4 g topically 4 (four) times daily. Apply to affected areas 4 times daily as needed for pain. 100 g 2   escitalopram (LEXAPRO) 10 MG tablet Take 1 tablet (10 mg total) by mouth daily. 90 tablet 3   fluticasone (FLONASE) 50 MCG/ACT nasal spray Place 2 sprays into both nostrils daily. 16 g 0   lidocaine (LIDODERM) 5 % Place 1 patch onto the skin daily. Remove & Discard patch within 12 hours or as directed by MD 30 patch 0   lisinopril (ZESTRIL) 40 MG tablet Take 1 tablet (40 mg total) by mouth daily. 90 tablet 3   Zinc Acetate, Oral, (ZINC  ACETATE PO) Take by mouth.     No current facility-administered medications on file prior to visit.    Past Surgical History:  Procedure Laterality Date   CESAREAN SECTION     x2   COLONOSCOPY     HERNIA REPAIR     Hernia Revision also.    POLYPECTOMY     TUBAL LIGATION      Allergies  Allergen Reactions   Sulfa Antibiotics Rash    Rash with sun exposure.     BP 129/78   Ht 5\' 4"  (1.626 m)   Wt 230 lb (104.3 kg)   BMI 39.48 kg/m       No data to display              No data to display              Objective:  Physical Exam:  Gen: NAD, comfortable in exam room  Left knee: No gross deformity, ecchymoses, swelling. Mild TTP medial > lateral joint line. FROM with normal strength. Negative ant/post drawers. Negative valgus/varus testing. Negative lachman.  Negative mcmurrays, apleys.  NV intact distally.   Assessment & Plan:  1. Left knee pain - exam is reassuring.  Known history of arthritis.  This is consistent with  this diagnosis.  Tylenol, topical medications, supplements, nsaids reviewed.  Intraarticular injection given today as below.  Home exercises reviewed.  F/u prn.  After informed written consent timeout was performed, patient was seated on exam table. Left knee was prepped with alcohol swab and utilizing anteromedial approach, patient's left knee was injected intraarticularly with 3:1 lidocaine: depomedrol. Patient tolerated the procedure well without immediate complications.

## 2023-05-03 ENCOUNTER — Encounter (INDEPENDENT_AMBULATORY_CARE_PROVIDER_SITE_OTHER): Payer: Self-pay

## 2023-05-16 ENCOUNTER — Ambulatory Visit (INDEPENDENT_AMBULATORY_CARE_PROVIDER_SITE_OTHER): Payer: 59 | Admitting: Internal Medicine

## 2023-05-16 ENCOUNTER — Telehealth: Payer: Self-pay | Admitting: Internal Medicine

## 2023-05-16 ENCOUNTER — Encounter: Payer: Self-pay | Admitting: Internal Medicine

## 2023-05-16 VITALS — BP 120/80 | HR 96 | Ht 64.0 in | Wt 231.0 lb

## 2023-05-16 DIAGNOSIS — E213 Hyperparathyroidism, unspecified: Secondary | ICD-10-CM
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
## 2023-05-16 NOTE — Telephone Encounter (Signed)
Appointment scheduled and mychart message sent

## 2023-05-16 NOTE — Telephone Encounter (Signed)
Please schedule the pt for a bone density at the [place] office for primary hyperparathyroidism      Thanks

## 2023-05-16 NOTE — Patient Instructions (Signed)
-   Please stay hydrated - AVOID CALCIUM SUPPLEMENTS, AVOID LOW CALCIUM DIET - Maintain normal dietary calcium intake (2-3 servings of dairy a day) - Continue Vitamin D 4000 iu daily

## 2023-05-23 ENCOUNTER — Inpatient Hospital Stay: Admission: RE | Admit: 2023-05-23 | Payer: 59 | Source: Ambulatory Visit

## 2023-06-01 ENCOUNTER — Ambulatory Visit (INDEPENDENT_AMBULATORY_CARE_PROVIDER_SITE_OTHER)
Admission: RE | Admit: 2023-06-01 | Discharge: 2023-06-01 | Disposition: A | Discharge status: Home / Self Care | Payer: 59 | Source: Ambulatory Visit | Attending: Internal Medicine | Admitting: Internal Medicine

## 2023-06-01 DIAGNOSIS — E213 Hyperparathyroidism, unspecified: Secondary | ICD-10-CM | POA: Diagnosis not present

## 2023-06-06 ENCOUNTER — Encounter: Payer: Self-pay | Admitting: Dermatology

## 2023-06-06 ENCOUNTER — Ambulatory Visit (INDEPENDENT_AMBULATORY_CARE_PROVIDER_SITE_OTHER): Payer: 59 | Admitting: Dermatology

## 2023-06-06 VITALS — BP 115/76 | HR 61

## 2023-06-06 DIAGNOSIS — D2272 Melanocytic nevi of left lower limb, including hip: Secondary | ICD-10-CM | POA: Diagnosis not present

## 2023-06-06 DIAGNOSIS — D239 Other benign neoplasm of skin, unspecified: Secondary | ICD-10-CM

## 2023-06-06 NOTE — Patient Instructions (Signed)

## 2023-06-06 NOTE — Progress Notes (Signed)
   Follow-Up Visit   Subjective  Tina Scott is a 57 y.o. female who presents for the following: Shave Excision of left lower leg  The following portions of the chart were reviewed this encounter and updated as appropriate: medications, allergies, medical history  Review of Systems:  No other skin or systemic complaints except as noted in HPI or Assessment and Plan.  Objective  Well appearing patient in no apparent distress; mood and affect are within normal limits.  A focused examination was performed of the following areas: Left lower leg Relevant physical exam findings are noted in the Assessment and Plan.   Left Lower Leg - Posterior 5mm pink atrophic plaque in area of previously biopsied severely dysplastic nevus         Assessment & Plan   Dysplastic nevus Left Lower Leg - Posterior  Epidermal / dermal shaving - Left Lower Leg - Posterior  Lesion diameter (cm):  0.8 Lesion diameter (cm) comment:  4mm margins Informed consent: discussed and consent obtained   Timeout: patient name, date of birth, surgical site, and procedure verified   Procedure prep:  Patient was prepped and draped in usual sterile fashion Prep type:  Isopropyl alcohol Anesthesia: the lesion was anesthetized in a standard fashion   Anesthetic:  1% lidocaine w/ epinephrine 1-100,000 buffered w/ 8.4% NaHCO3 Instrument used: DermaBlade   Hemostasis achieved with: aluminum chloride   Outcome: patient tolerated procedure well   Post-procedure details: sterile dressing applied and wound care instructions given   Dressing type: petrolatum   Additional details:  Pt aware that benign results will be sent to mychart and the staff will call abnormal results will  Specimen 1 - Surgical pathology Differential Diagnosis: [id number] Check Margins: No     No follow-ups on file.  Owens Shark, CMA, am acting as scribe for [employer], DO.   Documentation: I have reviewed the above  documentation for accuracy and completeness, and I agree with the above.  Langston Reusing, DO

## 2023-06-07 ENCOUNTER — Ambulatory Visit: Payer: 59 | Admitting: Nurse Practitioner

## 2023-06-07 ENCOUNTER — Encounter: Payer: Self-pay | Admitting: Nurse Practitioner

## 2023-06-07 VITALS — BP 110/68 | HR 96 | Temp 98.0°F | Ht 64.0 in | Wt 230.6 lb

## 2023-06-07 DIAGNOSIS — E559 Vitamin D deficiency, unspecified: Secondary | ICD-10-CM

## 2023-06-07 DIAGNOSIS — D126 Benign neoplasm of colon, unspecified: Secondary | ICD-10-CM

## 2023-06-07 DIAGNOSIS — E7800 Pure hypercholesterolemia, unspecified: Secondary | ICD-10-CM

## 2023-06-07 DIAGNOSIS — I10 Essential (primary) hypertension: Secondary | ICD-10-CM

## 2023-06-07 DIAGNOSIS — E669 Obesity, unspecified: Secondary | ICD-10-CM | POA: Insufficient documentation

## 2023-06-07 DIAGNOSIS — Z6839 Body mass index (BMI) 39.0-39.9, adult: Secondary | ICD-10-CM

## 2023-06-07 DIAGNOSIS — E213 Hyperparathyroidism, unspecified: Secondary | ICD-10-CM

## 2023-06-07 LAB — LIPID PANEL
Cholesterol: 197 mg/dL (ref 0–200)
HDL: 48.6 mg/dL (ref >39.00)
LDL Cholesterol: 129 mg/dL — ABNORMAL HIGH (ref 0–99)
NonHDL: 147.96
Total CHOL/HDL Ratio: 4
Triglycerides: 97 mg/dL (ref 0.0–149.0)
VLDL: 19.4 mg/dL (ref 0.0–40.0)

## 2023-06-07 LAB — RENAL FUNCTION PANEL
Albumin: 4.3 g/dL (ref 3.5–5.2)
BUN: 13 mg/dL (ref 6–23)
CO2: 25 mEq/L (ref 19–32)
Calcium: 10.6 mg/dL — ABNORMAL HIGH (ref 8.4–10.5)
Chloride: 107 mEq/L (ref 96–112)
Creatinine, Ser: 0.65 mg/dL (ref 0.40–1.20)
GFR: 98.65 mL/min (ref >60.00)
Glucose, Bld: 92 mg/dL (ref 70–99)
Phosphorus: 2.8 mg/dL (ref 2.3–4.6)
Potassium: 4.2 mEq/L (ref 3.5–5.1)
Sodium: 140 mEq/L (ref 135–145)

## 2023-06-07 LAB — SURGICAL PATHOLOGY

## 2023-06-07 LAB — VITAMIN D 25 HYDROXY (VIT D DEFICIENCY, FRACTURES): VITD: 36.72 ng/mL (ref 30.00–100.00)

## 2023-06-07 NOTE — Assessment & Plan Note (Signed)
BP at goal with amlodipine and lisnopril BP Readings from Last 3 Encounters:  06/07/23 110/68  06/06/23 115/76  05/16/23 120/80    Repeat BMP Maintain med doses

## 2023-06-07 NOTE — Assessment & Plan Note (Signed)
Last colonoscopy 2022: pathology report- Surgical [P], colon, ileocecal valve, polyp (1)TUBULAR ADENOMA (1 OF 1 FRAGMENTS), NO HIGH-GRADE DYSPLASIA OR MALIGNANCY IDENTIFIED. Surgical [P], colon, sigmoid, polyp (1) INFLAMMATORY POLYP (2 OF 2 FRAGMENTS), NO HIGH-GRADE DYSPLASIA OR MALIGNANCY IDENTIFIED. Repeat in 7yrs per Dr. Rhea Belton

## 2023-06-07 NOTE — Progress Notes (Signed)
Established Patient Visit  Patient: Tina Scott   DOB: 23-Mar-1966   57 y.o. Female  MRN: [id number] Visit Date: 06/07/2023  Subjective:    Chief Complaint  Patient presents with   Hypertension    Follow up and fasting lab work   Hypertension   Essential hypertension BP at goal with amlodipine and lisnopril BP Readings from Last 3 Encounters:  06/07/23 110/68  06/06/23 115/76  05/16/23 120/80    Repeat BMP Maintain med doses  Pure hypercholesterolemia Repeat lipid panel We discussed need for diet modification and daily exercise  Hypercalcemia Repeat Serum calcium Followed by endocrinology-Dr. Elisabeth Cara  Hyperparathyroidism [facility]) Under the care of endocrinology-Dr. Shamlefter Dexa scan 2022: normal Repeat PTH, calcium and vit. D today  Vitamin D insufficiency Repeat vit D today  Tubular adenoma of colon Last colonoscopy 2022: pathology report- Surgical [P], colon, ileocecal valve, polyp (1)TUBULAR ADENOMA (1 OF 1 FRAGMENTS), NO HIGH-GRADE DYSPLASIA OR MALIGNANCY IDENTIFIED. Surgical [P], colon, sigmoid, polyp (1) INFLAMMATORY POLYP (2 OF 2 FRAGMENTS), NO HIGH-GRADE DYSPLASIA OR MALIGNANCY IDENTIFIED. Repeat in 69yrs per Dr. Rhea Belton  Obesity 10lbs weight gain in last 1year Admits to binge eating in PM and skip meals due to schedule. Current counseling sessions. Currently takes lexapro 10mg  States she is aware of necessary dietary modifications and exercise needed. Wt Readings from Last 3 Encounters:  06/07/23 230 lb 9.6 oz (104.6 kg)  05/16/23 231 lb (104.8 kg)  04/30/23 230 lb (104.3 kg)    Advised to eat a high protein/complex carb breakfast and protein snack if unable to eat lunch. Consider use of vyvanse if unable to control binge eating F/up in 3-39months    Reviewed medical, surgical, and social history today  Medications: Outpatient Medications Prior to Visit  Medication Sig   amLODipine (NORVASC) 5 MG tablet Take 1 tablet (5 mg  total) by mouth at bedtime.   Ascorbic Acid (VITAMIN C PO) Take by mouth.   b complex vitamins tablet Take 1 tablet by mouth daily.   benzonatate (TESSALON) 100 MG capsule Take 1 capsule (100 mg total) by mouth 3 (three) times daily as needed.   Cholecalciferol (VITAMIN D3 PO) Take 4,000 Int'l Units/day by mouth daily at 12 noon.   diclofenac Sodium (VOLTAREN) 1 % GEL Apply 4 g topically 4 (four) times daily. Apply to affected areas 4 times daily as needed for pain.   escitalopram (LEXAPRO) 10 MG tablet Take 1 tablet (10 mg total) by mouth daily.   fluticasone (FLONASE) 50 MCG/ACT nasal spray Place 2 sprays into both nostrils daily.   lidocaine (LIDODERM) 5 % Place 1 patch onto the skin daily. Remove & Discard patch within 12 hours or as directed by MD   lisinopril (ZESTRIL) 40 MG tablet Take 1 tablet (40 mg total) by mouth daily.   Zinc Acetate, Oral, (ZINC ACETATE PO) Take by mouth.   No facility-administered medications prior to visit.   Reviewed past medical and social history.   ROS per HPI above  Last metabolic panel Lab Results  Component Value Date   GLUCOSE 76 12/05/2022   NA 140 12/05/2022   K 4.2 12/05/2022   CL 105 12/05/2022   CO2 27 12/05/2022   BUN 10 12/05/2022   CREATININE 0.65 12/05/2022   GFR 99.00 12/05/2022   CALCIUM 10.9 (H) 12/05/2022   PROT 7.2 12/05/2022   ALBUMIN 4.3 12/05/2022   LABGLOB 2.9 05/12/2022   AGRATIO  1.7 05/12/2022   BILITOT 0.6 12/05/2022   ALKPHOS 80 12/05/2022   AST 10 12/05/2022   ALT 11 12/05/2022   Last lipids Lab Results  Component Value Date   CHOL 252 (H) 12/05/2022   HDL 52.50 12/05/2022   LDLCALC 183 (H) 12/05/2022   TRIG 86.0 12/05/2022   CHOLHDL 5 12/05/2022   Last hemoglobin A1c No results found for: "HGBA1C" Last thyroid functions Lab Results  Component Value Date   TSH 2.10 11/24/2020   Last vitamin D Lab Results  Component Value Date   VD25OH 32.34 12/05/2022        Objective:  BP 110/68 (BP  Location: Left Arm)   Pulse 96   Temp 98 F (36.7 C)   Ht 5\' 4"  (1.626 m)   Wt 230 lb 9.6 oz (104.6 kg)   SpO2 97%   BMI 39.58 kg/m      Physical Exam Cardiovascular:     Rate and Rhythm: Normal rate and regular rhythm.     Pulses: Normal pulses.     Heart sounds: Normal heart sounds.  Pulmonary:     Effort: Pulmonary effort is normal.     Breath sounds: Normal breath sounds.  Musculoskeletal:     Cervical back: Normal range of motion and neck supple.     Right lower leg: No edema.     Left lower leg: No edema.  Lymphadenopathy:     Cervical: No cervical adenopathy.  Neurological:     Mental Status: She is alert and oriented to person, place, and time.  Psychiatric:        Mood and Affect: Mood normal.        Behavior: Behavior normal.        Thought Content: Thought content normal.     No results found for any visits on 06/07/23.    Assessment & Plan:    Problem List Items Addressed This Visit     Essential hypertension - Primary    BP at goal with amlodipine and lisnopril BP Readings from Last 3 Encounters:  06/07/23 110/68  06/06/23 115/76  05/16/23 120/80    Repeat BMP Maintain med doses      Relevant Orders   Renal Function Panel   Hypercalcemia    Repeat Serum calcium Followed by endocrinology-Dr. Elisabeth Cara      Relevant Orders   Calcium, ionized   Hyperparathyroidism (HCC)    Under the care of endocrinology-Dr. Shamlefter Dexa scan 2022: normal Repeat PTH, calcium and vit. D today      Relevant Orders   PTH, intact (no Ca)   Albumin   Calcium, ionized   Obesity    10lbs weight gain in last 1year Admits to binge eating in PM and skip meals due to schedule. Current counseling sessions. Currently takes lexapro 10mg  States she is aware of necessary dietary modifications and exercise needed. Wt Readings from Last 3 Encounters:  06/07/23 230 lb 9.6 oz (104.6 kg)  05/16/23 231 lb (104.8 kg)  04/30/23 230 lb (104.3 kg)    Advised to eat  a high protein/complex carb breakfast and protein snack if unable to eat lunch. Consider use of vyvanse if unable to control binge eating F/up in 3-61months      Pure hypercholesterolemia    Repeat lipid panel We discussed need for diet modification and daily exercise      Relevant Orders   Lipid panel   Tubular adenoma of colon    Last colonoscopy 2022: pathology report-  Surgical [P], colon, ileocecal valve, polyp (1)TUBULAR ADENOMA (1 OF 1 FRAGMENTS), NO HIGH-GRADE DYSPLASIA OR MALIGNANCY IDENTIFIED. Surgical [P], colon, sigmoid, polyp (1) INFLAMMATORY POLYP (2 OF 2 FRAGMENTS), NO HIGH-GRADE DYSPLASIA OR MALIGNANCY IDENTIFIED. Repeat in 47yrs per Dr. Rhea Belton      Vitamin D insufficiency    Repeat vit D today      Relevant Orders   VITAMIN D 25 Hydroxy (Vit-D Deficiency, Fractures)   Return in about 6 months (around 12/05/2023) for CPE (fasting).     Alysia Penna, NP

## 2023-06-07 NOTE — Assessment & Plan Note (Signed)
Repeat lipid panel We discussed need for diet modification and daily exercise

## 2023-06-07 NOTE — Patient Instructions (Signed)
Go to lab Continue Heart healthy diet and daily exercise. Maintain current medications.

## 2023-06-07 NOTE — Assessment & Plan Note (Signed)
Under the care of endocrinology-Dr. Shamlefter Dexa scan 2022: normal Repeat PTH, calcium and vit. D today

## 2023-06-07 NOTE — Assessment & Plan Note (Signed)
Repeat Serum calcium Followed by endocrinology-Dr. Elisabeth Cara

## 2023-06-07 NOTE — Assessment & Plan Note (Signed)
-  Repeat vit D today

## 2023-06-07 NOTE — Assessment & Plan Note (Addendum)
10lbs weight gain in last 1year Admits to binge eating in PM and skip meals due to schedule. Current counseling sessions. Currently takes lexapro 10mg  States she is aware of necessary dietary modifications and exercise needed. Wt Readings from Last 3 Encounters:  06/07/23 230 lb 9.6 oz (104.6 kg)  05/16/23 231 lb (104.8 kg)  04/30/23 230 lb (104.3 kg)    Advised to eat a high protein/complex carb breakfast and protein snack if unable to eat lunch. Consider use of vyvanse if unable to control binge eating F/up in 3-59months

## 2023-06-08 LAB — PARATHYROID HORMONE, INTACT (NO CA): PTH: 51 pg/mL (ref 16–77)

## 2023-06-08 LAB — CALCIUM, IONIZED: Calcium, Ion: 5.9 mg/dL — ABNORMAL HIGH (ref 4.7–5.5)

## 2023-06-12 NOTE — Progress Notes (Signed)
Surgical excision pathology report was reviewed and showed clear margins.  No additional treatment required.

## 2023-07-25 ENCOUNTER — Ambulatory Visit (INDEPENDENT_AMBULATORY_CARE_PROVIDER_SITE_OTHER): Payer: 59 | Admitting: Family Medicine

## 2023-07-25 ENCOUNTER — Encounter: Payer: Self-pay | Admitting: Family Medicine

## 2023-07-25 VITALS — BP 126/88 | Ht 64.0 in | Wt 230.0 lb

## 2023-07-25 DIAGNOSIS — M1711 Unilateral primary osteoarthritis, right knee: Secondary | ICD-10-CM | POA: Diagnosis not present

## 2023-07-25 DIAGNOSIS — M25562 Pain in left knee: Secondary | ICD-10-CM | POA: Diagnosis not present

## 2023-07-25 DIAGNOSIS — M1712 Unilateral primary osteoarthritis, left knee: Secondary | ICD-10-CM

## 2023-07-25 NOTE — Progress Notes (Signed)
PCP: Anne Ng, NP  Subjective:   HPI: Patient is a 57 y.o. female here for bilateral knee pain.  Patient has known history of bilateral knee arthritis. Seen by [place] in August and had steroid injection into left knee that helped though in past week+ pain has recurred again. Right knee in May was given monovisc injection with benefit and this one is not as bad. Wears a right medial unloader brace (one for left knee is broken). No new injuries.  Past Medical History:  Diagnosis Date   Adenomatous polyps    Anxiety 1986   Depression    Off and on for years   Dysplastic nevus 04/04/2023   left post lower leg. 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 90 tablet 3   Ascorbic Acid (VITAMIN C PO) Take by mouth.     b complex vitamins tablet Take 1 tablet by mouth daily.     benzonatate (TESSALON) 100 MG capsule Take 1 capsule (100 mg total) by mouth 3 (three) times daily as needed. 30 capsule 0   Cholecalciferol (VITAMIN D3 PO) Take 4,000 Int'l Units/day by mouth daily at 12 noon.     diclofenac Sodium (VOLTAREN) 1 % GEL Apply 4 g topically 4 (four) times daily. Apply to affected areas 4 times daily as needed for pain. 100 g 2   escitalopram (LEXAPRO) 10 MG tablet Take 1 tablet (10 mg total) by mouth daily. 90 tablet 3   fluticasone (FLONASE) 50 MCG/ACT nasal spray Place 2 sprays into both nostrils daily. 16 g 0   lidocaine (LIDODERM) 5 % Place 1 patch onto the skin daily. Remove & Discard patch within 12 hours or as directed by MD 30 patch 0   lisinopril (ZESTRIL) 40 MG tablet Take 1 tablet (40 mg total) by mouth daily. 90 tablet 3   Zinc Acetate, Oral, (ZINC ACETATE PO) Take by mouth.     No current facility-administered medications  on file prior to visit.    Past Surgical History:  Procedure Laterality Date   CESAREAN SECTION     x2   COLONOSCOPY     HERNIA REPAIR     Hernia Revision also.    POLYPECTOMY     TUBAL LIGATION      Allergies  Allergen Reactions   Sulfa Antibiotics Rash    Rash with sun exposure.     BP 126/88   Ht 5\' 4"  (1.626 m)   Wt 230 lb (104.3 kg)   BMI 39.48 kg/m       No data to display              No data to display              Objective:  Physical Exam:  Gen: NAD, comfortable in exam room  Bilateral knees: No gross deformity, ecchymoses, swelling. TTP medial joint line > lateral joint line bilateral knees though left worse.  Post patellar facet tenderness left knee. FROM with normal strength. Negative ant/post drawers. Negative valgus/varus testing. Negative lachman.  Negative mcmurrays, apleys.  NV intact distally.   Assessment & Plan:  1. Bilateral knee pain - 2/2 osteoarthritis.  Exam reassuring otherwise.  Discussed options - will proceed with approval  for monovisc for the worse left knee.  She feels like right knee is doing ok now.  Home exercise program reviewed.  Tylenol, topical medications, nsaids, supplements if needed.  F/u when monovisc approved.

## 2023-07-30 ENCOUNTER — Other Ambulatory Visit: Payer: Self-pay

## 2023-07-30 DIAGNOSIS — M1712 Unilateral primary osteoarthritis, left knee: Secondary | ICD-10-CM

## 2023-08-09 ENCOUNTER — Ambulatory Visit (HOSPITAL_BASED_OUTPATIENT_CLINIC_OR_DEPARTMENT_OTHER)
Admission: RE | Admit: 2023-08-09 | Discharge: 2023-08-09 | Disposition: A | Discharge status: Home / Self Care | Payer: 59 | Source: Ambulatory Visit | Attending: Family Medicine | Admitting: Family Medicine

## 2023-08-09 DIAGNOSIS — G8929 Other chronic pain: Secondary | ICD-10-CM | POA: Diagnosis not present

## 2023-08-09 DIAGNOSIS — M25562 Pain in left knee: Secondary | ICD-10-CM | POA: Diagnosis not present

## 2023-08-09 DIAGNOSIS — M1712 Unilateral primary osteoarthritis, left knee: Secondary | ICD-10-CM | POA: Diagnosis not present

## 2023-08-19 ENCOUNTER — Other Ambulatory Visit: Payer: Self-pay | Admitting: Nurse Practitioner

## 2023-08-19 DIAGNOSIS — I10 Essential (primary) hypertension: Secondary | ICD-10-CM

## 2023-08-19 DIAGNOSIS — F411 Generalized anxiety disorder: Secondary | ICD-10-CM

## 2023-08-20 ENCOUNTER — Other Ambulatory Visit (HOSPITAL_COMMUNITY): Payer: Self-pay

## 2023-08-20 MED ORDER — AMLODIPINE BESYLATE 5 MG PO TABS
5.0000 mg | ORAL_TABLET | Freq: Every day | ORAL | 3 refills | Status: DC
  Filled 2023-08-20: qty 90, 90d supply, fill #0
  Filled 2023-11-27: qty 90, 90d supply, fill #1
  Filled 2024-02-06 – 2024-02-23 (×2): qty 90, 90d supply, fill #2
  Filled 2024-06-01: qty 90, 90d supply, fill #3

## 2023-08-20 MED ORDER — ESCITALOPRAM OXALATE 10 MG PO TABS
10.0000 mg | ORAL_TABLET | Freq: Every day | ORAL | 3 refills | Status: DC
  Filled 2023-08-20: qty 90, 90d supply, fill #0
  Filled 2023-11-27: qty 90, 90d supply, fill #1
  Filled 2024-02-06 – 2024-02-23 (×2): qty 90, 90d supply, fill #2
  Filled 2024-06-01: qty 90, 90d supply, fill #3

## 2023-08-21 ENCOUNTER — Other Ambulatory Visit (HOSPITAL_COMMUNITY): Payer: Self-pay

## 2023-08-21 ENCOUNTER — Other Ambulatory Visit: Payer: Self-pay

## 2023-08-24 ENCOUNTER — Other Ambulatory Visit (HOSPITAL_COMMUNITY): Payer: Self-pay

## 2023-09-05 ENCOUNTER — Ambulatory Visit: Payer: 59 | Admitting: Family Medicine

## 2023-09-05 ENCOUNTER — Encounter: Payer: Self-pay | Admitting: Family Medicine

## 2023-09-05 ENCOUNTER — Other Ambulatory Visit: Payer: Self-pay

## 2023-09-05 VITALS — BP 132/86 | Ht 64.0 in | Wt 232.0 lb

## 2023-09-05 DIAGNOSIS — M1712 Unilateral primary osteoarthritis, left knee: Secondary | ICD-10-CM

## 2023-09-05 MED ORDER — HYALURONAN 88 MG/4ML IX SOSY
88.0000 mg | PREFILLED_SYRINGE | Freq: Once | INTRA_ARTICULAR | Status: AC
  Administered 2023-09-05: 88 mg via INTRA_ARTICULAR

## 2023-09-05 NOTE — Progress Notes (Signed)
PCP: Anne Ng, NP  Chief Complaint: Left knee pain  Subjective:   HPI: Patient is a 57 y.o. female here for Left knee pain.  Patient is here for Monovisc injection of the left knee.  Patient otherwise has no other concerns at this time..   Past Medical History:  Diagnosis Date   Adenomatous polyps    Anxiety 1986   Depression    Off and on for years   Dysplastic nevus 04/04/2023   left post lower leg. 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 90 tablet 3   Ascorbic Acid (VITAMIN C PO) Take by mouth.     b complex vitamins tablet Take 1 tablet by mouth daily.     benzonatate (TESSALON) 100 MG capsule Take 1 capsule (100 mg total) by mouth 3 (three) times daily as needed. 30 capsule 0   Cholecalciferol (VITAMIN D3 PO) Take 4,000 Int'l Units/day by mouth daily at 12 noon.     diclofenac Sodium (VOLTAREN) 1 % GEL Apply 4 g topically 4 (four) times daily. Apply to affected areas 4 times daily as needed for pain. 100 g 2   escitalopram (LEXAPRO) 10 MG tablet Take 1 tablet (10 mg total) by mouth daily. 90 tablet 3   fluticasone (FLONASE) 50 MCG/ACT nasal spray Place 2 sprays into both nostrils daily. 16 g 0   lidocaine (LIDODERM) 5 % Place 1 patch onto the skin daily. Remove & Discard patch within 12 hours or as directed by MD 30 patch 0   lisinopril (ZESTRIL) 40 MG tablet Take 1 tablet (40 mg total) by mouth daily. 90 tablet 3   Zinc Acetate, Oral, (ZINC ACETATE PO) Take by mouth.     No current facility-administered medications on file prior to visit.    Past Surgical History:  Procedure Laterality Date   CESAREAN SECTION     x2   COLONOSCOPY     HERNIA REPAIR     Hernia Revision also.    POLYPECTOMY     TUBAL LIGATION       Allergies  Allergen Reactions   Sulfa Antibiotics Rash    Rash with sun exposure.     BP 132/86   Ht 5\' 4"  (1.626 m)   Wt 232 lb (105.2 kg)   BMI 39.82 kg/m       No data to display              No data to display              Objective:  Physical Exam:  Gen: NAD, comfortable in exam room    Assessment & Plan:  1. 1. Primary osteoarthritis of left knee (Primary) - Patient tolerated procedure well without any difficulty, advised patient to follow-up as needed. - [place] LIMITED JOINT SPACE STRUCTURES LOW LEFT; Future   PROCEDURE:  Risks & benefits of monovisc knee  injection reviewed.  Consent obtained.  Time-out completed.  Patient prepped and draped in the normal fashion. Musculoskeletal ultrasound used to identify appropriate anatomy.  Patient noted to have small effusion, no other abnormalities.  After identifying appropriate anatomy, patient positioned & area cleansed with chlorhexadine.  Ethyl chloride spray used to anesthetize the skin.  Solution of 3  mL 1% lidocaine injected into the superior-lateral aspect of the left knee for local anesthesia under ultrasound guidance.  After ensuring adequate anesthesia monovisc gel solution 4mL then injected in the left knee under ultrasound guidance.  Needle well visualized in the left knee joint.  Images saved.  Patient tolerated procedure well without any complications.  Area covered with adhesive bandage.  Post-procedure care reviewed.  All questions answered.     Brenton Grills MD, PGY-4  Sports Medicine Fellow [facility]

## 2023-09-05 NOTE — Patient Instructions (Signed)
 Today you received an injection with hyaluronic acid (aka: HA injection or a gel/lubricating injection). This injection is usually done for osteoarthritis of the joint to help with temporary pain relief and temporary improvement of joint function. HA injections are not effective for everyone, and there is no scientific proof that they can restore cartilage.  We also used some "numbing medicine" (Lidocaine) with this injection to help make the procedure more comfortable for you.  The injected area may be numb and feel really good for the next couple of hours - the numbing medicine usually wears off in 2-3 hours.  After the numbing wear off you may note some pain and discomfort from the injection.      The actually benefit from the hyaluronic acid injection is usually noticed within 6-8 weeks, but may take longer.   It is not uncommon to experience discomfort and inflammation around the joint after the injection.    Things to watch out for that you should contact us or a health care provider urgently would include: 1. Unusual (as in more than 10%) increase in pain 2. New fever > 101.5 3. New swelling or redness of the injected area. 4. Streaking of red lines around the area injected.  Do not hesitate to call or reach out with any questions or concerns.

## 2023-10-02 ENCOUNTER — Ambulatory Visit
Admission: RE | Admit: 2023-10-02 | Discharge: 2023-10-02 | Disposition: A | Discharge status: Home / Self Care | Payer: 59 | Source: Ambulatory Visit | Attending: Family Medicine | Admitting: Family Medicine

## 2023-10-02 ENCOUNTER — Other Ambulatory Visit (HOSPITAL_BASED_OUTPATIENT_CLINIC_OR_DEPARTMENT_OTHER): Payer: Self-pay

## 2023-10-02 VITALS — BP 145/87 | HR 87 | Temp 100.3°F | Resp 20

## 2023-10-02 DIAGNOSIS — J101 Influenza due to other identified influenza virus with other respiratory manifestations: Secondary | ICD-10-CM

## 2023-10-02 LAB — POCT INFLUENZA A/B
Influenza A, POC: POSITIVE — AB
Influenza B, POC: NEGATIVE

## 2023-10-02 MED ORDER — BENZONATATE 100 MG PO CAPS
100.0000 mg | ORAL_CAPSULE | Freq: Three times a day (TID) | ORAL | 0 refills | Status: DC | PRN
Start: 2023-10-02 — End: 2023-12-12
  Filled 2023-10-02: qty 30, 10d supply, fill #0

## 2023-10-02 MED ORDER — PROMETHAZINE-DM 6.25-15 MG/5ML PO SYRP
5.0000 mL | ORAL_SOLUTION | Freq: Three times a day (TID) | ORAL | 0 refills | Status: DC | PRN
  Filled 2023-10-02: qty 200, 14d supply, fill #0

## 2023-10-02 MED ORDER — OSELTAMIVIR PHOSPHATE 75 MG PO CAPS
75.0000 mg | ORAL_CAPSULE | Freq: Two times a day (BID) | ORAL | 0 refills | Status: DC
  Filled 2023-10-02: qty 10, 5d supply, fill #0

## 2023-10-02 NOTE — Discharge Instructions (Addendum)
For sore throat or cough try using a honey-based tea. Use 3 teaspoons of honey with juice squeezed from half lemon. Place shaved pieces of ginger into 1/2-1 cup of water and warm over stove top. Then mix the ingredients and repeat every 4 hours as needed. Please take ibuprofen 600mg  every 6 hours with food alternating with OR taken together with Tylenol 500mg -650mg  every 6 hours for throat pain, fevers, aches and pains. Hydrate very well with at least 2 liters of water. Eat light meals such as soups (chicken and noodles, vegetable, chicken and wild rice).  Do not eat foods that you are allergic to.  Taking an antihistamine like Zyrtec (10mg  daily) can help against postnasal drainage, sinus congestion which can cause sinus pain, sinus headaches, throat pain, painful swallowing, coughing.  You can take this together with pseudoephedrine (Sudafed) at a dose of 60 mg 3 times a day or twice daily as needed for the same kind of nasal drip, congestion.

## 2023-10-02 NOTE — ED Triage Notes (Signed)
 Pt c/o cough, fever x 2 days-tylenol last dose yesterday-reports neg covid with health at [facility] gait

## 2023-10-02 NOTE — ED Provider Notes (Signed)
 [facility]  Note:  This document was prepared using Conservation officer, historic buildings and may include unintentional dictation errors.  MRN: [id number] DOB: [birth year]  Subjective:   Tina Scott is a 58 y.o. female presenting for 2-day history of fever, malaise, fatigue, coughing, congestion, throat pain.  Had a COVID test and was negative.  No history of asthma.  Would like Tamiflu .  No smoking of any kind including cigarettes, cigars, vaping, marijuana use.    No current facility-administered medications for this encounter.  Current Outpatient Medications:    amLODipine  (NORVASC ) 5 MG tablet, Take 1 tablet (5 mg total) by mouth at bedtime., Disp: 90 tablet, Rfl: 3   Ascorbic Acid (VITAMIN C PO), Take by mouth., Disp: , Rfl:    b complex vitamins tablet, Take 1 tablet by mouth daily., Disp: , Rfl:    benzonatate  (TESSALON ) 100 MG capsule, Take 1 capsule (100 mg total) by mouth 3 (three) times daily as needed., Disp: 30 capsule, Rfl: 0   Cholecalciferol (VITAMIN D3 PO), Take 4,000 Int'l Units/day by mouth daily at 12 noon., Disp: , Rfl:    diclofenac  Sodium (VOLTAREN ) 1 % GEL, Apply 4 g topically 4 (four) times daily. Apply to affected areas 4 times daily as needed for pain., Disp: 100 g, Rfl: 2   escitalopram  (LEXAPRO ) 10 MG tablet, Take 1 tablet (10 mg total) by mouth daily., Disp: 90 tablet, Rfl: 3   fluticasone  (FLONASE ) 50 MCG/ACT nasal spray, Place 2 sprays into both nostrils daily., Disp: 16 g, Rfl: 0   lidocaine  (LIDODERM ) 5 %, Place 1 patch onto the skin daily. Remove & Discard patch within 12 hours or as directed by MD, Disp: 30 patch, Rfl: 0   lisinopril  (ZESTRIL ) 40 MG tablet, Take 1 tablet (40 mg total) by mouth daily., Disp: 90 tablet, Rfl: 3   Zinc Acetate, Oral, (ZINC ACETATE PO), Take by mouth., Disp: , Rfl:    Allergies  Allergen Reactions   Sulfa Antibiotics Rash    Rash with sun exposure.     Past Medical History:  Diagnosis Date    Adenomatous polyps    Anxiety 1986   Depression    Off and on for years   Dysplastic nevus 04/04/2023   left post lower leg. 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   Left Shoulder - anterior     Past Surgical History:  Procedure Laterality Date   CESAREAN SECTION     x2   COLONOSCOPY     HERNIA REPAIR     Hernia Revision also.    POLYPECTOMY     TUBAL LIGATION      Family History  Problem Relation Age of Onset   Colon polyps Mother    Hypertension Mother    Hyperlipidemia Mother    Osteoporosis Mother    Colon polyps Father    Colon cancer Maternal Grandmother    Cancer Maternal Grandmother 26       colon cancer   Kidney disease Paternal Grandfather    Depression Daughter    Esophageal cancer Neg Hx    Stomach cancer Neg Hx    Rectal cancer Neg Hx     Social History   Tobacco Use   Smoking status: Former    Current packs/day: 0.00    Average packs/day: 0.3 packs/day for 10.0 years (2.5 ttl pk-yrs)    Types: Cigarettes    Start date: 07/10/1980  Quit date: 07/10/1990    Years since quitting: 33.2   Smokeless tobacco: Never  Vaping Use   Vaping status: Never Used  Substance Use Topics   Alcohol use: Yes    Alcohol/week: 4.0 standard drinks of alcohol    Types: 2 Glasses of wine, 2 Cans of beer per week    Comment: occasionally   Drug use: Never    ROS   Objective:   Vitals: BP (!) 145/87 (BP Location: Right Arm)   Pulse 87   Temp 100.3 F (37.9 C) (Oral)   Resp 20   SpO2 96%   Physical Exam Constitutional:      General: She is not in acute distress.    Appearance: Normal appearance. She is well-developed. She is not ill-appearing, toxic-appearing or diaphoretic.  HENT:     Head: Normocephalic and atraumatic.     Nose: Nose normal.     Mouth/Throat:     Mouth: Mucous membranes are moist.  Eyes:     General: No scleral icterus.       Right eye: No discharge.        Left eye: No  discharge.     Extraocular Movements: Extraocular movements intact.  Cardiovascular:     Rate and Rhythm: Normal rate and regular rhythm.     Heart sounds: Normal heart sounds. No murmur heard.    No friction rub. No gallop.  Pulmonary:     Effort: Pulmonary effort is normal. No respiratory distress.     Breath sounds: No stridor. No wheezing, rhonchi or rales.  Chest:     Chest wall: No tenderness.  Skin:    General: Skin is warm and dry.  Neurological:     General: No focal deficit present.     Mental Status: She is alert and oriented to person, place, and time.  Psychiatric:        Mood and Affect: Mood normal.        Behavior: Behavior normal.     Results for orders placed or performed during the hospital encounter of 10/02/23 (from the past 24 hours)  POCT Influenza A/B     Status: Abnormal   Collection Time: 10/02/23  5:11 PM  Result Value Ref Range   Influenza A, POC Positive (A)    Influenza B, POC Negative     Assessment and Plan :   PDMP not reviewed this encounter.  1. Influenza A    Deferred imaging given clear cardiopulmonary exam, hemodynamically stable vital signs. Will cover for influenza with Tamiflu  given + results.  Use supportive care, rest, fluids, hydration, light meals, schedule Tylenol and ibuprofen. Counseled patient on potential for adverse effects with medications prescribed today, patient verbalized understanding. ER and return-to-clinic precautions discussed, patient verbalized understanding.    Christopher Savannah, [place] 10/02/23 1810

## 2023-12-12 ENCOUNTER — Encounter: Payer: Self-pay | Admitting: Nurse Practitioner

## 2023-12-12 ENCOUNTER — Encounter: Payer: 59 | Admitting: Nurse Practitioner

## 2023-12-12 ENCOUNTER — Ambulatory Visit (INDEPENDENT_AMBULATORY_CARE_PROVIDER_SITE_OTHER): Payer: 59 | Admitting: Nurse Practitioner

## 2023-12-12 ENCOUNTER — Other Ambulatory Visit (HOSPITAL_BASED_OUTPATIENT_CLINIC_OR_DEPARTMENT_OTHER): Payer: Self-pay

## 2023-12-12 VITALS — BP 124/78 | HR 67 | Temp 97.9°F | Ht 64.0 in | Wt 229.0 lb

## 2023-12-12 DIAGNOSIS — E213 Hyperparathyroidism, unspecified: Secondary | ICD-10-CM

## 2023-12-12 DIAGNOSIS — I10 Essential (primary) hypertension: Secondary | ICD-10-CM

## 2023-12-12 DIAGNOSIS — E7800 Pure hypercholesterolemia, unspecified: Secondary | ICD-10-CM

## 2023-12-12 DIAGNOSIS — Z0000 Encounter for general adult medical examination without abnormal findings: Secondary | ICD-10-CM

## 2023-12-12 DIAGNOSIS — Z0001 Encounter for general adult medical examination with abnormal findings: Secondary | ICD-10-CM

## 2023-12-12 LAB — LIPID PANEL
Cholesterol: 233 mg/dL — ABNORMAL HIGH (ref 0–200)
HDL: 40.9 mg/dL (ref >39.00)
LDL Cholesterol: 169 mg/dL — ABNORMAL HIGH (ref 0–99)
NonHDL: 192.32
Total CHOL/HDL Ratio: 6
Triglycerides: 119 mg/dL (ref 0.0–149.0)
VLDL: 23.8 mg/dL (ref 0.0–40.0)

## 2023-12-12 LAB — COMPREHENSIVE METABOLIC PANEL
ALT: 13 U/L (ref 0–35)
AST: 12 U/L (ref 0–37)
Albumin: 4.3 g/dL (ref 3.5–5.2)
Alkaline Phosphatase: 77 U/L (ref 39–117)
BUN: 13 mg/dL (ref 6–23)
CO2: 30 meq/L (ref 19–32)
Calcium: 10.3 mg/dL (ref 8.4–10.5)
Chloride: 106 meq/L (ref 96–112)
Creatinine, Ser: 0.6 mg/dL (ref 0.40–1.20)
GFR: 100.21 mL/min (ref >60.00)
Glucose, Bld: 85 mg/dL (ref 70–99)
Potassium: 3.9 meq/L (ref 3.5–5.1)
Sodium: 141 meq/L (ref 135–145)
Total Bilirubin: 0.6 mg/dL (ref 0.2–1.2)
Total Protein: 6.8 g/dL (ref 6.0–8.3)

## 2023-12-12 LAB — CBC
HCT: 39.5 % (ref 36.0–46.0)
Hemoglobin: 13.2 g/dL (ref 12.0–15.0)
MCHC: 33.5 g/dL (ref 30.0–36.0)
MCV: 85.9 fl (ref 78.0–100.0)
Platelets: 334 10*3/uL (ref 150.0–400.0)
RBC: 4.6 Mil/uL (ref 3.87–5.11)
RDW: 14.2 % (ref 11.5–15.5)
WBC: 6.9 10*3/uL (ref 4.0–10.5)

## 2023-12-12 MED ORDER — LISINOPRIL 40 MG PO TABS
40.0000 mg | ORAL_TABLET | Freq: Every day | ORAL | 3 refills | Status: AC
  Filled 2023-12-12: qty 90, 90d supply, fill #0
  Filled 2024-02-06 – 2024-03-03 (×3): qty 90, 90d supply, fill #1
  Filled 2024-03-06: qty 90, 90d supply, fill #0
  Filled 2024-03-06 – 2024-06-26 (×2): qty 90, 90d supply, fill #1
  Filled 2024-09-19: qty 90, 90d supply, fill #2

## 2023-12-12 NOTE — Assessment & Plan Note (Signed)
 Under the care of endocrinology-Dr. Shamlefter Dexa scan 2022: normal Repeat PTH, and calcium

## 2023-12-12 NOTE — Patient Instructions (Signed)
 Go to lab Maintain Heart healthy diet and daily exercise. Maintain current medications.  Preventive Care 48-57 Years Old, Female Preventive care refers to lifestyle choices and visits with your health care provider that can promote health and wellness. Preventive care visits are also called wellness exams. What can I expect for my preventive care visit? Counseling Your health care provider may ask you questions about your: Medical history, including: Past medical problems. Family medical history. Pregnancy history. Current health, including: Menstrual cycle. Method of birth control. Emotional well-being. Home life and relationship well-being. Sexual activity and sexual health. Lifestyle, including: Alcohol, nicotine or tobacco, and drug use. Access to firearms. Diet, exercise, and sleep habits. Work and work Astronomer. Sunscreen use. Safety issues such as seatbelt and bike helmet use. Physical exam Your health care provider will check your: Height and weight. These may be used to calculate your BMI (body mass index). BMI is a measurement that tells if you are at a healthy weight. Waist circumference. This measures the distance around your waistline. This measurement also tells if you are at a healthy weight and may help predict your risk of certain diseases, such as type 2 diabetes and high blood pressure. Heart rate and blood pressure. Body temperature. Skin for abnormal spots. What immunizations do I need?  Vaccines are usually given at various ages, according to a schedule. Your health care provider will recommend vaccines for you based on your age, medical history, and lifestyle or other factors, such as travel or where you work. What tests do I need? Screening Your health care provider may recommend screening tests for certain conditions. This may include: Lipid and cholesterol levels. Diabetes screening. This is done by checking your blood sugar (glucose) after you  have not eaten for a while (fasting). Pelvic exam and Pap test. Hepatitis B test. Hepatitis C test. HIV (human immunodeficiency virus) test. STI (sexually transmitted infection) testing, if you are at risk. Lung cancer screening. Colorectal cancer screening. Mammogram. Talk with your health care provider about when you should start having regular mammograms. This may depend on whether you have a family history of breast cancer. BRCA-related cancer screening. This may be done if you have a family history of breast, ovarian, tubal, or peritoneal cancers. Bone density scan. This is done to screen for osteoporosis. Talk with your health care provider about your test results, treatment options, and if necessary, the need for more tests. Follow these instructions at home: Eating and drinking  Eat a diet that includes fresh fruits and vegetables, whole grains, lean protein, and low-fat dairy products. Take vitamin and mineral supplements as recommended by your health care provider. Do not drink alcohol if: Your health care provider tells you not to drink. You are pregnant, may be pregnant, or are planning to become pregnant. If you drink alcohol: Limit how much you have to 0-1 drink a day. Know how much alcohol is in your drink. In the [place] one drink equals one 12 oz bottle of beer (355 mL), one 5 oz glass of wine (148 mL), or one 1 oz glass of hard liquor (44 mL). Lifestyle Brush your teeth every morning and night with fluoride toothpaste. Floss one time each day. Exercise for at least 30 minutes 5 or more days each week. Do not use any products that contain nicotine or tobacco. These products include cigarettes, chewing tobacco, and vaping devices, such as e-cigarettes. If you need help quitting, ask your health care provider. Do not use drugs. If you are  sexually active, practice safe sex. Use a condom or other form of protection to prevent STIs. If you do not wish to become pregnant, use  a form of birth control. If you plan to become pregnant, see your health care provider for a prepregnancy visit. Take aspirin only as told by your health care provider. Make sure that you understand how much to take and what form to take. Work with your health care provider to find out whether it is safe and beneficial for you to take aspirin daily. Find healthy ways to manage stress, such as: Meditation, yoga, or listening to music. Journaling. Talking to a trusted person. Spending time with friends and family. Minimize exposure to UV radiation to reduce your risk of skin cancer. Safety Always wear your seat belt while driving or riding in a vehicle. Do not drive: If you have been drinking alcohol. Do not ride with someone who has been drinking. When you are tired or distracted. While texting. If you have been using any mind-altering substances or drugs. Wear a helmet and other protective equipment during sports activities. If you have firearms in your house, make sure you follow all gun safety procedures. Seek help if you have been physically or sexually abused. What's next? Visit your health care provider once a year for an annual wellness visit. Ask your health care provider how often you should have your eyes and teeth checked. Stay up to date on all vaccines. This information is not intended to replace advice given to you by your health care provider. Make sure you discuss any questions you have with your health care provider. Document Revised: 03/02/2021 Document Reviewed: 03/02/2021 Elsevier Patient Education  2024 [employer].

## 2023-12-12 NOTE — Assessment & Plan Note (Signed)
 BP at goal with amlodipine and lisnopril BP Readings from Last 3 Encounters:  12/12/23 124/78  10/02/23 (!) 145/87  09/05/23 132/86    Repeat BMP Maintain med doses

## 2023-12-12 NOTE — Progress Notes (Signed)
 Complete physical exam  Patient: Tina Scott   DOB: Nov 08, 1965   58 y.o. Female  MRN: [id number] Visit Date: 12/12/2023  Subjective:    Chief Complaint  Patient presents with   Annual Exam   Tina Scott is a 58 y.o. female who presents today for a complete physical exam. She reports consuming a general diet.  Walking at work-14000 to 17000steps a day  She generally feels well. She reports sleeping well. She does not have additional problems to discuss today.  Vision:No Dental:No STD Screen:No  BP Readings from Last 3 Encounters:  12/12/23 124/78  10/02/23 (!) 145/87  09/05/23 132/86   Wt Readings from Last 3 Encounters:  12/12/23 229 lb (103.9 kg)  09/05/23 232 lb (105.2 kg)  07/25/23 230 lb (104.3 kg)   Most recent fall risk assessment:    12/12/2023    8:38 AM  Fall Risk   Falls in the past year? 0  Number falls in past yr: 0  Injury with Fall? 0  Risk for fall due to : No Fall Risks  Follow up Falls evaluation completed   Depression screen:Yes - Depression Most recent depression screenings:    12/12/2023    8:38 AM 06/07/2023    8:55 AM  PHQ 2/9 Scores  PHQ - 2 Score 0 0  PHQ- 9 Score 2 0    HPI  Essential hypertension BP at goal with amlodipine and lisnopril BP Readings from Last 3 Encounters:  12/12/23 124/78  10/02/23 (!) 145/87  09/05/23 132/86    Repeat BMP Maintain med doses  Hyperparathyroidism (HCC) Under the care of endocrinology-Dr. Elisabeth Cara Dexa scan 2022: normal Repeat PTH, and calcium  Pure hypercholesterolemia Repeat lipid panel We discussed need for diet modification and daily exercise   Past Medical History:  Diagnosis Date   Adenomatous polyps    Anxiety 1986   Depression    Off and on for years   Dysplastic nevus 04/04/2023   left post lower leg. 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   Left Shoulder - anterior   Past Surgical History:  Procedure  Laterality Date   CESAREAN SECTION     x2   COLONOSCOPY     HERNIA REPAIR     Hernia Revision also.    POLYPECTOMY     TUBAL LIGATION     Social History   Socioeconomic History   Marital status: Married    Spouse name: Not on file   Number of children: 2   Years of education: Not on file   Highest education level: Bachelor's degree (e.g., BA, AB, BS)  Occupational History   Occupation: Copywriter, advertising: [employer]  Tobacco Use   Smoking status: Former    Current packs/day: 0.00    Average packs/day: 0.3 packs/day for 10.0 years (2.5 ttl pk-yrs)    Types: Cigarettes    Start date: 07/10/1980    Quit date: 07/10/1990    Years since quitting: 33.4   Smokeless tobacco: Never  Vaping Use   Vaping status: Never Used  Substance and Sexual Activity   Alcohol use: Yes    Alcohol/week: 4.0 standard drinks of alcohol    Types: 2 Glasses of wine, 2 Cans of beer per week    Comment: occasionally   Drug use: Never   Sexual activity: Yes    Birth control/protection: Surgical  Other Topics Concern   Not on file  Social  History Narrative   Not on file   Social Drivers of Health   Financial Resource Strain: Low Risk  (12/11/2023)   Overall Financial Resource Strain (CARDIA)    Difficulty of Paying Living Expenses: Not very hard  Food Insecurity: No Food Insecurity (12/11/2023)   Hunger Vital Sign    Worried About Running Out of Food in the Last Year: Never true    Ran Out of Food in the Last Year: Never true  Transportation Needs: No Transportation Needs (12/11/2023)   PRAPARE - Administrator, Civil Service (Medical): No    Lack of Transportation (Non-Medical): No  Physical Activity: Insufficiently Active (12/11/2023)   Exercise Vital Sign    Days of Exercise per Week: 4 days    Minutes of Exercise per Session: 30 min  Stress: Stress Concern Present (12/11/2023)   [employer] of Occupational Health - Occupational Stress Questionnaire    Feeling  of Stress : To some extent  Social Connections: Socially Integrated (12/11/2023)   Social Connection and Isolation Panel [NHANES]    Frequency of Communication with Friends and Family: Three times a week    Frequency of Social Gatherings with Friends and Family: Twice a week    Attends Religious Services: More than 4 times per year    Active Member of [employer] or Organizations: Yes    Attends Engineer, structural: More than 4 times per year    Marital Status: Married  Catering manager Violence: Not on file   Family Status  Relation Name Status   Mother E Alive   Father  Alive   Daughter D (Not Specified)   MGM Ramie Deceased   PGF A (Not Specified)   Neg Hx  (Not Specified)  No partnership data on file   Family History  Problem Relation Age of Onset   Colon polyps Mother    Hypertension Mother    Hyperlipidemia Mother    Osteoporosis Mother    Alzheimer's disease Mother 66   Colon polyps Father    Atrial fibrillation Father 40   Depression Daughter    Colon cancer Maternal Grandmother    Cancer Maternal Grandmother 32       colon cancer   Kidney disease Paternal Grandfather    Esophageal cancer Neg Hx    Stomach cancer Neg Hx    Rectal cancer Neg Hx    Allergies  Allergen Reactions   Sulfa Antibiotics Rash    Rash with sun exposure.     Patient Care Team: Luc Shammas, Bonna Gains, NP as PCP - General (Internal Medicine) Glyn Ade, PA-C as Physician Assistant (Dermatology) [facility], Konrad Dolores, MD as Attending Physician (Endocrinology)   Medications: Outpatient Medications Prior to Visit  Medication Sig   amLODipine (NORVASC) 5 MG tablet Take 1 tablet (5 mg total) by mouth at bedtime.   Ascorbic Acid (VITAMIN C PO) Take by mouth.   b complex vitamins tablet Take 1 tablet by mouth daily.   Cholecalciferol (VITAMIN D3 PO) Take 4,000 Int'l Units/day by mouth daily at 12 noon.   diclofenac Sodium (VOLTAREN) 1 % GEL Apply 4 g topically 4 (four) times  daily. Apply to affected areas 4 times daily as needed for pain.   escitalopram (LEXAPRO) 10 MG tablet Take 1 tablet (10 mg total) by mouth daily.   Zinc Acetate, Oral, (ZINC ACETATE PO) Take by mouth.   [DISCONTINUED] benzonatate (TESSALON) 100 MG capsule Take 1 capsule (100 mg total) by mouth 3 (three) times  daily as needed for cough.   [DISCONTINUED] fluticasone (FLONASE) 50 MCG/ACT nasal spray Place 2 sprays into both nostrils daily.   [DISCONTINUED] lidocaine (LIDODERM) 5 % Place 1 patch onto the skin daily. Remove & Discard patch within 12 hours or as directed by MD   [DISCONTINUED] lisinopril (ZESTRIL) 40 MG tablet Take 1 tablet (40 mg total) by mouth daily.   [DISCONTINUED] oseltamivir (TAMIFLU) 75 MG capsule Take 1 capsule (75 mg total) by mouth 2 (two) times daily.   [DISCONTINUED] promethazine-dextromethorphan (PROMETHAZINE-DM) 6.25-15 MG/5ML syrup Take 5 mLs by mouth 3 (three) times daily as needed for cough.   No facility-administered medications prior to visit.    Review of Systems  Constitutional:  Negative for activity change, appetite change and unexpected weight change.  Respiratory: Negative.    Cardiovascular: Negative.   Gastrointestinal: Negative.   Endocrine: Negative for cold intolerance and heat intolerance.  Genitourinary: Negative.   Musculoskeletal: Negative.   Skin: Negative.   Neurological: Negative.   Hematological: Negative.   Psychiatric/Behavioral:  Negative for behavioral problems, decreased concentration, dysphoric mood, hallucinations, self-injury, sleep disturbance and suicidal ideas. The patient is not nervous/anxious.         Objective:  BP 124/78 (BP Location: Left Arm, Patient Position: Sitting, Cuff Size: Normal)   Pulse 67   Temp 97.9 F (36.6 C) (Temporal)   Ht 5\' 4"  (1.626 m)   Wt 229 lb (103.9 kg)   SpO2 98%   BMI 39.31 kg/m     Physical Exam Vitals and nursing note reviewed.  Constitutional:      General: She is not in acute  distress. HENT:     Right Ear: Tympanic membrane, ear canal and external ear normal.     Left Ear: Tympanic membrane, ear canal and external ear normal.     Nose: Nose normal.  Eyes:     Extraocular Movements: Extraocular movements intact.     Conjunctiva/sclera: Conjunctivae normal.     Pupils: Pupils are equal, round, and reactive to light.  Neck:     Thyroid: No thyroid mass, thyromegaly or thyroid tenderness.  Cardiovascular:     Rate and Rhythm: Normal rate and regular rhythm.     Pulses: Normal pulses.     Heart sounds: Normal heart sounds.  Pulmonary:     Effort: Pulmonary effort is normal.     Breath sounds: Normal breath sounds.  Abdominal:     General: Bowel sounds are normal.     Palpations: Abdomen is soft.  Musculoskeletal:        General: Normal range of motion.     Cervical back: Normal range of motion and neck supple.     Right lower leg: No edema.     Left lower leg: No edema.  Lymphadenopathy:     Cervical: No cervical adenopathy.  Skin:    General: Skin is warm and dry.  Neurological:     Mental Status: She is alert and oriented to person, place, and time.     Cranial Nerves: No cranial nerve deficit.  Psychiatric:        Mood and Affect: Mood normal.        Behavior: Behavior normal.        Thought Content: Thought content normal.      Results for orders placed or performed in visit on 12/12/23  Comprehensive metabolic panel  Result Value Ref Range   Sodium 141 135 - 145 mEq/L   Potassium 3.9 3.5 - 5.1 mEq/L  Chloride 106 96 - 112 mEq/L   CO2 30 19 - 32 mEq/L   Glucose, Bld 85 70 - 99 mg/dL   BUN 13 6 - 23 mg/dL   Creatinine, Ser 2.13 0.40 - 1.20 mg/dL   Total Bilirubin 0.6 0.2 - 1.2 mg/dL   Alkaline Phosphatase 77 39 - 117 U/L   AST 12 0 - 37 U/L   ALT 13 0 - 35 U/L   Total Protein 6.8 6.0 - 8.3 g/dL   Albumin 4.3 3.5 - 5.2 g/dL   GFR 086.57 >84.69 mL/min   Calcium 10.3 8.4 - 10.5 mg/dL  CBC  Result Value Ref Range   WBC 6.9 4.0 - 10.5  K/uL   RBC 4.60 3.87 - 5.11 Mil/uL   Platelets 334.0 150.0 - 400.0 K/uL   Hemoglobin 13.2 12.0 - 15.0 g/dL   HCT 62.9 52.8 - 41.3 %   MCV 85.9 78.0 - 100.0 fl   MCHC 33.5 30.0 - 36.0 g/dL   RDW 24.4 01.0 - 27.2 %  Lipid panel  Result Value Ref Range   Cholesterol 233 (H) 0 - 200 mg/dL   Triglycerides 536.6 0.0 - 149.0 mg/dL   HDL 44.03 >47.42 mg/dL   VLDL 59.5 0.0 - 63.8 mg/dL   LDL Cholesterol 756 (H) 0 - 99 mg/dL   Total CHOL/HDL Ratio 6    NonHDL 192.32       Assessment & Plan:    Routine Health Maintenance and Physical Exam  Immunization History  Administered Date(s) Administered   Hepatitis B, ADULT 01/15/2017, 02/16/2017, 06/28/2017   Hepatitis B, PED/ADOLESCENT 01/15/2017, 02/16/2017, 06/28/2017   Influenza,inj,Quad PF,6+ Mos 06/28/2017   Influenza,inj,Quad PF,6-35 Mos 06/28/2018   Influenza-Unspecified 05/19/2016, 06/14/2020, 06/18/2021   MMR 01/15/2017, 02/16/2017   PFIZER Comirnaty(Gray Top)Covid-19 Tri-Sucrose Vaccine 10/10/2019, 10/31/2019, 08/20/2020   PFIZER(Purple Top)SARS-COV-2 Vaccination 10/10/2019, 10/31/2019, 08/20/2020   PPD Test 01/02/2017, 01/15/2017   Tdap 12/26/2016   Zoster Recombinant(Shingrix) 06/01/2021, 11/30/2021    Health Maintenance  Topic Date Due   HIV Screening  Never done   Hepatitis C Screening  Never done   COVID-19 Vaccine (7 - 2024-25 season) 05/20/2023   INFLUENZA VACCINE  12/17/2023 (Originally 04/19/2023)   MAMMOGRAM  05/24/2024   Cervical Cancer Screening (HPV/Pap Cotest)  11/24/2025   Colonoscopy  10/20/2026   DTaP/Tdap/Td (2 - Td or Tdap) 12/27/2026   Zoster Vaccines- Shingrix  Completed   HPV VACCINES  Aged Out    Discussed health benefits of physical activity, and encouraged her to engage in regular exercise appropriate for her age and condition.  Problem List Items Addressed This Visit     Essential hypertension   BP at goal with amlodipine and lisnopril BP Readings from Last 3 Encounters:  12/12/23 124/78   10/02/23 (!) 145/87  09/05/23 132/86    Repeat BMP Maintain med doses      Relevant Medications   lisinopril (ZESTRIL) 40 MG tablet   Hypercalcemia   Relevant Orders   PTH, intact (no Ca)   Hyperparathyroidism (HCC)   Under the care of endocrinology-Dr. Shamlefter Dexa scan 2022: normal Repeat PTH, and calcium      Relevant Orders   PTH, intact (no Ca)   Pure hypercholesterolemia   Repeat lipid panel We discussed need for diet modification and daily exercise      Relevant Medications   lisinopril (ZESTRIL) 40 MG tablet   Other Relevant Orders   Lipid panel (Completed)   Other Visit Diagnoses  Encounter for preventative adult health care exam with abnormal findings    -  Primary   Relevant Orders   Comprehensive metabolic panel (Completed)   CBC (Completed)      Return in about 6 months (around 06/13/2024) for HTN, hyperlipidemia (fasting).     Alysia Penna, NP

## 2023-12-12 NOTE — Assessment & Plan Note (Signed)
Repeat lipid panel We discussed need for diet modification and daily exercise

## 2023-12-13 ENCOUNTER — Encounter: Payer: Self-pay | Admitting: Nurse Practitioner

## 2023-12-13 LAB — PARATHYROID HORMONE, INTACT (NO CA): PTH: 68 pg/mL (ref 16–77)

## 2024-01-16 ENCOUNTER — Ambulatory Visit
Admission: RE | Admit: 2024-01-16 | Discharge: 2024-01-16 | Disposition: A | Discharge status: Home / Self Care | Source: Ambulatory Visit | Attending: Physician Assistant | Admitting: Physician Assistant

## 2024-01-16 VITALS — BP 139/81 | HR 88 | Temp 98.6°F | Resp 17

## 2024-01-16 DIAGNOSIS — J069 Acute upper respiratory infection, unspecified: Secondary | ICD-10-CM | POA: Diagnosis not present

## 2024-01-16 LAB — POC COVID19/FLU A&B COMBO
Covid Antigen, POC: NEGATIVE
Influenza A Antigen, POC: NEGATIVE
Influenza B Antigen, POC: NEGATIVE

## 2024-01-16 NOTE — ED Provider Notes (Signed)
 [name]    CSN: [id number] Arrival date & time: 01/16/24  1020     History   Chief Complaint Chief Complaint  Patient presents with   Sore Throat    Dry hacking cough 3+days, some nasal congestion sore throat 3+days fatigue malaise fever headache - Entered by patient    HPI Tina Scott is a 58 y.o. female.   Patient presents today for evaluation of cough, nasal congestion, sore throat, fatigue and fever that started about 3 days ago.  She reports she has had headache as well.  Cough is dry.  She has tried taking over-the-counter medication and medication that she was previously prescribed when she had the flu with some relief.  She denies any vomiting or associated diarrhea.  The history is provided by the patient.  Sore Throat Associated symptoms include headaches. Pertinent negatives include no abdominal pain and no shortness of breath.    Past Medical History:  Diagnosis Date   Adenomatous polyps    Anxiety 1986   Depression    Off and on for years   Dysplastic nevus 04/04/2023   left post lower leg. 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   Left Shoulder - anterior    Patient Active Problem List   Diagnosis Date Noted   Tubular adenoma of colon 06/07/2023   Obesity 06/07/2023   Left knee pain 04/30/2023   GAD (generalized anxiety disorder) 07/06/2022   Peroneal tendinitis of left lower leg 06/14/2022   OA (osteoarthritis) of knee 05/15/2022   Vitamin D  insufficiency 06/16/2021   Hyperparathyroidism (HCC) 03/09/2021   Hypercalcemia 11/25/2020   Hx of basal cell carcinoma excision 11/24/2020   Pure hypercholesterolemia 05/25/2020   Hx of colonic polyps 05/25/2020   Carpal tunnel syndrome on right 01/02/2017   Essential hypertension 08/18/2016    Past Surgical History:  Procedure Laterality Date   CESAREAN SECTION     x2   COLONOSCOPY     HERNIA REPAIR     Hernia Revision also.     POLYPECTOMY     TUBAL LIGATION      OB History   No obstetric history on file.      Home Medications    Prior to Admission medications   Medication Sig Start Date End Date Taking? Authorizing Provider  amLODipine  (NORVASC ) 5 MG tablet Take 1 tablet (5 mg total) by mouth at bedtime. 08/20/23   Nche, Connye Delaine, NP  Ascorbic Acid (VITAMIN C PO) Take by mouth.    [provider]  b complex vitamins tablet Take 1 tablet by mouth daily.    [provider]  Cholecalciferol (VITAMIN D3 PO) Take 4,000 Int'l Units/day by mouth daily at 12 noon.    [provider]  diclofenac  Sodium (VOLTAREN ) 1 % GEL Apply 4 g topically 4 (four) times daily. Apply to affected areas 4 times daily as needed for pain. 05/12/22   Eloise Hake Scales, PA-C  escitalopram  (LEXAPRO ) 10 MG tablet Take 1 tablet (10 mg total) by mouth daily. 08/20/23   Nche, Connye Delaine, NP  lisinopril  (ZESTRIL ) 40 MG tablet Take 1 tablet (40 mg total) by mouth daily. 12/12/23   Nche, Connye Delaine, NP  Zinc Acetate, Oral, (ZINC ACETATE PO) Take by mouth.    [provider]    Family History Family History  Problem Relation Age of Onset   Colon polyps Mother    Hypertension Mother    Hyperlipidemia  Mother    Osteoporosis Mother    Alzheimer's disease Mother 80   Colon polyps Father    Atrial fibrillation Father 96   Depression Daughter    Colon cancer Maternal Grandmother    Cancer Maternal Grandmother 45       colon cancer   Kidney disease Paternal Grandfather    Esophageal cancer Neg Hx    Stomach cancer Neg Hx    Rectal cancer Neg Hx     Social History Social History   Tobacco Use   Smoking status: Former    Current packs/day: 0.00    Average packs/day: 0.3 packs/day for 10.0 years (2.5 ttl pk-yrs)    Types: Cigarettes    Start date: 07/10/1980    Quit date: 07/10/1990    Years since quitting: 33.5   Smokeless tobacco: Never  Vaping Use   Vaping status: Never Used   Substance Use Topics   Alcohol use: Yes    Alcohol/week: 4.0 standard drinks of alcohol    Types: 2 Glasses of wine, 2 Cans of beer per week    Comment: occasionally   Drug use: Never     Allergies   Sulfa antibiotics   Review of Systems Review of Systems  Constitutional:  Positive for fatigue and fever.  HENT:  Positive for congestion and sore throat. Negative for ear pain.   Eyes:  Negative for discharge and redness.  Respiratory:  Positive for cough. Negative for shortness of breath and wheezing.   Gastrointestinal:  Negative for abdominal pain, diarrhea, nausea and vomiting.  Neurological:  Positive for headaches.     Physical Exam Triage Vital Signs ED Triage Vitals  Encounter Vitals Group     BP      Systolic BP Percentile      Diastolic BP Percentile      Pulse      Resp      Temp      Temp src      SpO2      Weight      Height      Head Circumference      Peak Flow      Pain Score      Pain Loc      Pain Education      Exclude from Growth Chart    No data found.  Updated Vital Signs BP 139/81 (BP Location: Right Arm)   Pulse 88   Temp 98.6 F (37 C) (Oral)   Resp 17   SpO2 95%   Visual Acuity Right Eye Distance:   Left Eye Distance:   Bilateral Distance:    Right Eye Near:   Left Eye Near:    Bilateral Near:     Physical Exam Vitals and nursing note reviewed.  Constitutional:      General: She is not in acute distress.    Appearance: Normal appearance. She is not ill-appearing.  HENT:     Head: Normocephalic and atraumatic.     Right Ear: Tympanic membrane normal.     Left Ear: Tympanic membrane normal.     Nose: Congestion present.     Mouth/Throat:     Mouth: Mucous membranes are moist.     Pharynx: No oropharyngeal exudate or posterior oropharyngeal erythema.  Eyes:     Conjunctiva/sclera: Conjunctivae normal.  Cardiovascular:     Rate and Rhythm: Normal rate and regular rhythm.     Heart sounds: Normal heart sounds. No  murmur heard. Pulmonary:  Effort: Pulmonary effort is normal. No respiratory distress.     Breath sounds: Normal breath sounds. No wheezing, rhonchi or rales.  Skin:    General: Skin is warm and dry.  Neurological:     Mental Status: She is alert.  Psychiatric:        Mood and Affect: Mood normal.        Thought Content: Thought content normal.      UC Treatments / Results  Labs (all labs ordered are listed, but only abnormal results are displayed) Labs Reviewed  POC COVID19/FLU A&B COMBO    EKG   Radiology No results found.  Procedures Procedures (including critical care time)  Medications Ordered in UC Medications - No data to display  Initial Impression / Assessment and Plan / UC Course  I have reviewed the triage vital signs and the nursing notes.  Pertinent labs & imaging results that were available during my care of the patient were reviewed by me and considered in my medical decision making (see chart for details).    COVID and flu screening negative in office.  Suspect other viral etiology of upper respiratory infection and recommended symptomatic treatment, increase fluids and rest.  Encouraged follow-up if no gradual improvement or with any further concerns.  Final Clinical Impressions(s) / UC Diagnoses   Final diagnoses:  Viral upper respiratory tract infection   Discharge Instructions   None    ED Prescriptions   None    PDMP not reviewed this encounter.   Vernestine Gondola, PA-C 01/16/24 1231

## 2024-01-16 NOTE — ED Triage Notes (Signed)
 Pt c/o dry cough for, nasal congestion sore throat, fatigue, fever, headache since Saturday.

## 2024-02-07 ENCOUNTER — Other Ambulatory Visit (HOSPITAL_COMMUNITY): Payer: Self-pay

## 2024-02-25 ENCOUNTER — Other Ambulatory Visit (HOSPITAL_COMMUNITY): Payer: Self-pay

## 2024-02-26 ENCOUNTER — Other Ambulatory Visit: Payer: Self-pay

## 2024-03-06 ENCOUNTER — Encounter (HOSPITAL_COMMUNITY): Payer: Self-pay

## 2024-03-06 ENCOUNTER — Other Ambulatory Visit: Payer: Self-pay

## 2024-03-06 ENCOUNTER — Other Ambulatory Visit (HOSPITAL_COMMUNITY): Payer: Self-pay

## 2024-03-12 ENCOUNTER — Other Ambulatory Visit (HOSPITAL_BASED_OUTPATIENT_CLINIC_OR_DEPARTMENT_OTHER): Payer: Self-pay | Admitting: Nurse Practitioner

## 2024-03-12 DIAGNOSIS — Z1231 Encounter for screening mammogram for malignant neoplasm of breast: Secondary | ICD-10-CM

## 2024-03-20 ENCOUNTER — Encounter (HOSPITAL_BASED_OUTPATIENT_CLINIC_OR_DEPARTMENT_OTHER): Payer: Self-pay

## 2024-03-20 ENCOUNTER — Ambulatory Visit (HOSPITAL_BASED_OUTPATIENT_CLINIC_OR_DEPARTMENT_OTHER)
Admission: RE | Admit: 2024-03-20 | Discharge: 2024-03-20 | Disposition: A | Discharge status: Home / Self Care | Source: Ambulatory Visit | Attending: Nurse Practitioner | Admitting: Nurse Practitioner

## 2024-03-20 DIAGNOSIS — Z1231 Encounter for screening mammogram for malignant neoplasm of breast: Secondary | ICD-10-CM | POA: Diagnosis not present

## 2024-04-02 ENCOUNTER — Ambulatory Visit: Payer: 59 | Admitting: Dermatology

## 2024-04-02 ENCOUNTER — Encounter: Payer: Self-pay | Admitting: Dermatology

## 2024-04-02 VITALS — BP 124/79 | HR 71

## 2024-04-02 DIAGNOSIS — W908XXA Exposure to other nonionizing radiation, initial encounter: Secondary | ICD-10-CM

## 2024-04-02 DIAGNOSIS — D1801 Hemangioma of skin and subcutaneous tissue: Secondary | ICD-10-CM | POA: Diagnosis not present

## 2024-04-02 DIAGNOSIS — L570 Actinic keratosis: Secondary | ICD-10-CM | POA: Diagnosis not present

## 2024-04-02 DIAGNOSIS — L578 Other skin changes due to chronic exposure to nonionizing radiation: Secondary | ICD-10-CM | POA: Diagnosis not present

## 2024-04-02 DIAGNOSIS — L814 Other melanin hyperpigmentation: Secondary | ICD-10-CM | POA: Diagnosis not present

## 2024-04-02 DIAGNOSIS — Z85828 Personal history of other malignant neoplasm of skin: Secondary | ICD-10-CM | POA: Diagnosis not present

## 2024-04-02 DIAGNOSIS — Z86018 Personal history of other benign neoplasm: Secondary | ICD-10-CM

## 2024-04-02 DIAGNOSIS — D225 Melanocytic nevi of trunk: Secondary | ICD-10-CM | POA: Diagnosis not present

## 2024-04-02 DIAGNOSIS — D485 Neoplasm of uncertain behavior of skin: Secondary | ICD-10-CM

## 2024-04-02 DIAGNOSIS — L821 Other seborrheic keratosis: Secondary | ICD-10-CM | POA: Diagnosis not present

## 2024-04-02 DIAGNOSIS — D492 Neoplasm of unspecified behavior of bone, soft tissue, and skin: Secondary | ICD-10-CM

## 2024-04-02 DIAGNOSIS — D229 Melanocytic nevi, unspecified: Secondary | ICD-10-CM

## 2024-04-02 DIAGNOSIS — Z1283 Encounter for screening for malignant neoplasm of skin: Secondary | ICD-10-CM

## 2024-04-02 NOTE — Patient Instructions (Addendum)
Cryotherapy Aftercare  Wash gently with soap and water everyday.   Apply Vaseline and Band-Aid daily until healed.  Patient Handout: Wound Care for Skin Biopsy Site  Taking Care of Your Skin Biopsy Site  Proper care of the biopsy site is essential for promoting healing and minimizing scarring. This handout provides instructions on how to care for your biopsy site to ensure optimal recovery.  1. Cleaning the Wound:  Clean the biopsy site daily with gentle soap and water. Gently pat the area dry with a clean, soft towel. Avoid harsh scrubbing or rubbing the area, as this can irritate the skin and delay healing.  2. Applying Aquaphor and Bandage:  After cleaning the wound, apply a thin layer of Aquaphor ointment to the biopsy site. Cover the area with a sterile bandage to protect it from dirt, bacteria, and friction. Change the bandage daily or as needed if it becomes soiled or wet.  3. Continued Care for One Week:  Repeat the cleaning, Aquaphor application, and bandaging process daily for one week following the biopsy procedure. Keeping the wound clean and moist during this initial healing period will help prevent infection and promote optimal healing.  4. Massaging Aquaphor into the Area:  ---After one week, discontinue the use of bandages but continue to apply Aquaphor to the biopsy site. ----Gently massage the Aquaphor into the area using circular motions. ---Massaging the skin helps to promote circulation and prevent the formation of scar tissue.   Additional Tips:  Avoid exposing the biopsy site to direct sunlight during the healing process, as this can cause hyperpigmentation or worsen scarring. If you experience any signs of infection, such as increased redness, swelling, warmth, or drainage from the wound, contact your healthcare provider immediately. Follow any additional instructions provided by your healthcare provider for caring for the biopsy site and managing any  discomfort. Conclusion:  Taking proper care of your skin biopsy site is crucial for ensuring optimal healing and minimizing scarring. By following these instructions for cleaning, applying Aquaphor, and massaging the area, you can promote a smooth and successful recovery. If you have any questions or concerns about caring for your biopsy site, don't hesitate to contact your healthcare provider for guidance.    Important Information   Due to recent changes in healthcare laws, you may see results of your pathology and/or laboratory studies on MyChart before the doctors have had a chance to review them. We understand that in some cases there may be results that are confusing or concerning to you. Please understand that not all results are received at the same time and often the doctors may need to interpret multiple results in order to provide you with the best plan of care or course of treatment. Therefore, we ask that you please give [place] 2 business days to thoroughly review all your results before contacting the office for clarification. Should we see a critical lab result, you will be contacted sooner.     If You Need Anything After Your Visit   If you have any questions or concerns for your doctor, please call our main line at [phone]. If no one answers, please leave a voicemail as directed and we will return your call as soon as possible. Messages left after 4 pm will be answered the following business day.    You may also send [place] a message via MyChart. We typically respond to MyChart messages within 1-2 business days.  For prescription refills, please ask your pharmacy to contact our  office. Our fax number is [phone].  If you have an urgent issue when the clinic is closed that cannot wait until the next business day, you can page your doctor at the number below.     Please note that while we do our best to be available for urgent issues outside of office hours, we are not available  24/7.    If you have an urgent issue and are unable to reach [place], you may choose to seek medical care at your doctor's office, retail clinic, urgent care center, or emergency room.   If you have a medical emergency, please immediately call 911 or go to the emergency department. In the event of inclement weather, please call our main line at [phone] for an update on the status of any delays or closures.  Dermatology Medication Tips: Please keep the boxes that topical medications come in in order to help keep track of the instructions about where and how to use these. Pharmacies typically print the medication instructions only on the boxes and not directly on the medication tubes.   If your medication is too expensive, please contact our office at [phone] or send [place] a message through MyChart.    We are unable to tell what your co-pay for medications will be in advance as this is different depending on your insurance coverage. However, we may be able to find a substitute medication at lower cost or fill out paperwork to get insurance to cover a needed medication.    If a prior authorization is required to get your medication covered by your insurance company, please allow [place] 1-2 business days to complete this process.   Drug prices often vary depending on where the prescription is filled and some pharmacies may offer cheaper prices.   The website www.goodrx.com contains coupons for medications through different pharmacies. The prices here do not account for what the cost may be with help from insurance (it may be cheaper with your insurance), but the website can give you the price if you did not use any insurance.  - You can print the associated coupon and take it with your prescription to the pharmacy.  - You may also stop by our office during regular business hours and pick up a GoodRx coupon card.  - If you need your prescription sent electronically to a different pharmacy, notify our  office through [facility] or by phone at [phone]

## 2024-04-02 NOTE — Progress Notes (Signed)
 Total Body Skin Exam (TBSE) Visit   Subjective  Tina Scott is a 58 y.o. female who presents for the following: Skin Cancer Screening and Full Body Skin Exam  Patient presents today for follow up visit for TBSE. Patient was last evaluated on 06/06/23 . Patient denies medication changes. Patient reports she does have spots, moles and lesions of concern to be evaluated. Patient reports throughout her lifetime she has had Severe sun exposure. Currently, patient reports if she has excessive sun exposure, she does apply sunscreen and/or wears protective coverings. Patient reports she has hx of bx (Hx of BCC and DN). Patient reports  family history of skin cancers. The patient has spots, moles and lesions to be evaluated, some may be new or changing and the patient has concerns that these could be cancer.  The following portions of the chart were reviewed this encounter and updated as appropriate: medications, allergies, medical history  Review of Systems:  No other skin or systemic complaints except as noted in HPI or Assessment and Plan.  Objective  Well appearing patient in no apparent distress; mood and affect are within normal limits.  A full examination was performed including scalp, head, eyes, ears, nose, lips, neck, chest, axillae, abdomen, back, buttocks, bilateral upper extremities, bilateral lower extremities, hands, feet, fingers, toes, fingernails, and toenails. All findings within normal limits unless otherwise noted below.   Relevant physical exam findings are noted in the Assessment and Plan.       5 mm dark brown Macule R/O DN    Assessment & Plan   LENTIGINES, SEBORRHEIC KERATOSES, HEMANGIOMAS - Benign normal skin lesions - Benign-appearing - Call for any changes  MELANOCYTIC NEVI - Tan-brown and/or pink-flesh-colored symmetric macules and papules - Benign appearing on exam today - Observation - Call clinic for new or changing moles - Recommend daily use of  broad spectrum spf 30+ sunscreen to sun-exposed areas.   ACTINIC DAMAGE - Chronic condition, secondary to cumulative UV/sun exposure - diffuse scaly erythematous macules with underlying dyspigmentation - Recommend daily broad spectrum sunscreen SPF 30+ to sun-exposed areas, reapply every 2 hours as needed.  - Staying in the shade or wearing long sleeves, sun glasses (UVA+UVB protection) and wide brim hats (4-inch brim around the entire circumference of the hat) are also recommended for sun protection.  - Call for new or changing lesions.  History of Dysplastic Nevi - No evidence of recurrence today - Recommend regular full body skin exams - Recommend daily broad spectrum sunscreen SPF 30+ to sun-exposed areas, reapply every 2 hours as needed.  - Call if any new or changing lesions are noted between office visits  HISTORY OF BASAL CELL CARCINOMA OF THE SKIN Left Anterior Shoulder (MOHS) - No evidence of recurrence today - Recommend regular full body skin exams - Recommend daily broad spectrum sunscreen SPF 30+ to sun-exposed areas, reapply every 2 hours as needed.  - Call if any new or changing lesions are noted between office visits  ACTINIC KERATOSIS Exam: Erythematous thin papules/macules with gritty scale at the Middle Forehead  Actinic keratoses are precancerous spots that appear secondary to cumulative UV radiation exposure/sun exposure over time. They are chronic with expected duration over 1 year. A portion of actinic keratoses will progress to squamous cell carcinoma of the skin. It is not possible to reliably predict which spots will progress to skin cancer and so treatment is recommended to prevent development of skin cancer.  Recommend daily broad spectrum sunscreen SPF 30+  to sun-exposed areas, reapply every 2 hours as needed.  Recommend staying in the shade or wearing long sleeves, sun glasses (UVA+UVB protection) and wide brim hats (4-inch brim around the entire circumference  of the hat). Call for new or changing lesions.  Treatment Plan: - Cryo Therapy completed while in office today  SKIN CANCER SCREENING PERFORMED TODAY.    Return in about 1 year (around 04/02/2025) for TBSE.  I, Jetta Ager, am acting as Neurosurgeon for [employer], DO.  Documentation: I have reviewed the above documentation for accuracy and completeness, and I agree with the above.  Delon Lenis, DO

## 2024-04-03 LAB — SURGICAL PATHOLOGY

## 2024-04-08 ENCOUNTER — Ambulatory Visit: Payer: Self-pay | Admitting: Dermatology
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
## 2024-05-21 ENCOUNTER — Encounter: Payer: Self-pay | Admitting: Internal Medicine

## 2024-05-21 ENCOUNTER — Ambulatory Visit (INDEPENDENT_AMBULATORY_CARE_PROVIDER_SITE_OTHER): Payer: 59 | Admitting: Internal Medicine

## 2024-05-21 VITALS — BP 132/80 | HR 72 | Ht 64.0 in | Wt 234.5 lb

## 2024-05-21 DIAGNOSIS — E213 Hyperparathyroidism, unspecified: Secondary | ICD-10-CM

## 2024-05-21 NOTE — Patient Instructions (Signed)
-   Please stay hydrated - AVOID CALCIUM SUPPLEMENTS, AVOID LOW CALCIUM DIET - Maintain normal dietary calcium intake (2-3 servings of dairy a day) - Continue Vitamin D  4000 iu daily   24-Hour Urine Collection  You will be collecting your urine for a 24-hour period of time. Your timer starts with your first urine of the morning (For example - If you first pee at 9AM, your timer will start at 9AM) Throw away your first urine of the morning Collect your urine every time you pee for the next 24 hours STOP your urine collection 24 hours after you started the collection (For example - You would stop at 9AM the day after you started)

## 2024-06-18 ENCOUNTER — Ambulatory Visit: Admitting: Nurse Practitioner

## 2024-06-25 ENCOUNTER — Ambulatory Visit: Admitting: Nurse Practitioner

## 2024-06-26 ENCOUNTER — Ambulatory Visit: Admitting: Nurse Practitioner

## 2024-06-26 ENCOUNTER — Other Ambulatory Visit (HOSPITAL_BASED_OUTPATIENT_CLINIC_OR_DEPARTMENT_OTHER): Payer: Self-pay

## 2024-06-26 ENCOUNTER — Ambulatory Visit: Payer: Self-pay | Admitting: Nurse Practitioner

## 2024-06-26 VITALS — BP 122/74 | HR 60 | Temp 98.1°F | Ht 64.0 in | Wt 235.0 lb

## 2024-06-26 DIAGNOSIS — E7800 Pure hypercholesterolemia, unspecified: Secondary | ICD-10-CM

## 2024-06-26 DIAGNOSIS — R051 Acute cough: Secondary | ICD-10-CM | POA: Diagnosis not present

## 2024-06-26 DIAGNOSIS — I10 Essential (primary) hypertension: Secondary | ICD-10-CM

## 2024-06-26 DIAGNOSIS — E559 Vitamin D deficiency, unspecified: Secondary | ICD-10-CM | POA: Diagnosis not present

## 2024-06-26 DIAGNOSIS — F411 Generalized anxiety disorder: Secondary | ICD-10-CM

## 2024-06-26 DIAGNOSIS — E213 Hyperparathyroidism, unspecified: Secondary | ICD-10-CM

## 2024-06-26 LAB — BASIC METABOLIC PANEL WITH GFR
BUN: 14 mg/dL (ref 6–23)
CO2: 29 meq/L (ref 19–32)
Calcium: 10.4 mg/dL (ref 8.4–10.5)
Chloride: 103 meq/L (ref 96–112)
Creatinine, Ser: 0.68 mg/dL (ref 0.40–1.20)
GFR: 96.86 mL/min (ref >60.00)
Glucose, Bld: 87 mg/dL (ref 70–99)
Potassium: 4 meq/L (ref 3.5–5.1)
Sodium: 138 meq/L (ref 135–145)

## 2024-06-26 LAB — LIPID PANEL
Cholesterol: 262 mg/dL — ABNORMAL HIGH (ref 0–200)
HDL: 44.3 mg/dL (ref >39.00)
LDL Cholesterol: 194 mg/dL — ABNORMAL HIGH (ref 0–99)
NonHDL: 217.72
Total CHOL/HDL Ratio: 6
Triglycerides: 119 mg/dL (ref 0.0–149.0)
VLDL: 23.8 mg/dL (ref 0.0–40.0)

## 2024-06-26 LAB — VITAMIN D 25 HYDROXY (VIT D DEFICIENCY, FRACTURES): VITD: 27.98 ng/mL — ABNORMAL LOW (ref 30.00–100.00)

## 2024-06-26 LAB — ALBUMIN: Albumin: 4.4 g/dL (ref 3.5–5.2)

## 2024-06-26 MED ORDER — AMLODIPINE BESYLATE 5 MG PO TABS
5.0000 mg | ORAL_TABLET | Freq: Every day | ORAL | 3 refills | Status: AC
  Filled 2024-06-26 – 2024-08-26 (×2): qty 90, 90d supply, fill #0

## 2024-06-26 MED ORDER — ESCITALOPRAM OXALATE 10 MG PO TABS
10.0000 mg | ORAL_TABLET | Freq: Every day | ORAL | 3 refills | Status: AC
  Filled 2024-06-26 – 2024-08-26 (×2): qty 90, 90d supply, fill #0

## 2024-06-26 NOTE — Assessment & Plan Note (Signed)
 Onset after viral illness in August. Intermittent cough, non productive No CP, no SOB. Admits to worsening GERD symptoms. No OVER THE COUNTER med used.  Advised to try omeprazole 20mg  daily x 14days.

## 2024-06-26 NOTE — Assessment & Plan Note (Signed)
 BP at goal with amlodipine  and lisnopril BP Readings from Last 3 Encounters:  06/26/24 122/74  05/21/24 132/80  04/02/24 124/79    Repeat BMP Maintain med doses

## 2024-06-26 NOTE — Progress Notes (Signed)
 Established Patient Visit  Patient: Tina Scott   DOB: 17-Jan-1966   58 y.o. Female  MRN: [id number] Visit Date: 06/26/2024  Subjective:    Chief Complaint  Patient presents with   Follow-up    FASTING 6 month follow up for HTN, Hyperlipidemia    HPI Essential hypertension BP at goal with amlodipine  and lisnopril BP Readings from Last 3 Encounters:  06/26/24 122/74  05/21/24 132/80  04/02/24 124/79    Repeat BMP Maintain med doses  Acute cough Onset after viral illness in August. Intermittent cough, non productive No CP, no SOB. Admits to worsening GERD symptoms. No OVER THE COUNTER med used.  Advised to try omeprazole 20mg  daily x 14days.  GAD (generalized anxiety disorder) stable mood with lexapro . Refill sent  Reviewed medical, surgical, and social history today  Medications: Outpatient Medications Prior to Visit  Medication Sig   Ascorbic Acid (VITAMIN C PO) Take by mouth.   b complex vitamins tablet Take 1 tablet by mouth daily.   Cholecalciferol (VITAMIN D3 PO) Take 4,000 Int'l Units/day by mouth daily at 12 noon.   diclofenac  Sodium (VOLTAREN ) 1 % GEL Apply 4 g topically 4 (four) times daily. Apply to affected areas 4 times daily as needed for pain.   lisinopril  (ZESTRIL ) 40 MG tablet Take 1 tablet (40 mg total) by mouth daily.   Zinc Acetate, Oral, (ZINC ACETATE PO) Take by mouth.   [DISCONTINUED] amLODipine  (NORVASC ) 5 MG tablet Take 1 tablet (5 mg total) by mouth at bedtime.   [DISCONTINUED] escitalopram  (LEXAPRO ) 10 MG tablet Take 1 tablet (10 mg total) by mouth daily.   No facility-administered medications prior to visit.   Reviewed past medical and social history.   ROS per HPI above      Objective:  BP 122/74 (BP Location: Left Arm, Patient Position: Sitting, Cuff Size: Large)   Pulse 60   Temp 98.1 F (36.7 C) (Oral)   Ht 5' 4 (1.626 m)   Wt 235 lb (106.6 kg)   SpO2 97%   BMI 40.34 kg/m      Physical Exam Vitals  and nursing note reviewed.  HENT:     Mouth/Throat:     Mouth: Mucous membranes are moist.     Pharynx: Oropharynx is clear. Uvula midline. No posterior oropharyngeal erythema or postnasal drip.  Cardiovascular:     Rate and Rhythm: Normal rate and regular rhythm.     Pulses: Normal pulses.     Heart sounds: Normal heart sounds.  Musculoskeletal:     Right lower leg: No edema.     Left lower leg: No edema.  Neurological:     Mental Status: She is oriented to person, place, and time.     No results found for any visits on 06/26/24.    Assessment & Plan:    Problem List Items Addressed This Visit     Acute cough   Onset after viral illness in August. Intermittent cough, non productive No CP, no SOB. Admits to worsening GERD symptoms. No OVER THE COUNTER med used.  Advised to try omeprazole 20mg  daily x 14days.      Essential hypertension - Primary   BP at goal with amlodipine  and lisnopril BP Readings from Last 3 Encounters:  06/26/24 122/74  05/21/24 132/80  04/02/24 124/79    Repeat BMP Maintain med doses      Relevant Medications   amLODipine  (NORVASC )  5 MG tablet   Other Relevant Orders   Basic metabolic panel with GFR   GAD (generalized anxiety disorder)   stable mood with lexapro . Refill sent      Relevant Medications   escitalopram  (LEXAPRO ) 10 MG tablet   Hypercalcemia   Relevant Orders   Albumin   Hyperparathyroidism   Relevant Orders   PTH, intact (no Ca)   Albumin   Pure hypercholesterolemia   Relevant Medications   amLODipine  (NORVASC ) 5 MG tablet   Other Relevant Orders   Lipid panel   Vitamin D  insufficiency   Relevant Orders   VITAMIN D  25 Hydroxy (Vit-D Deficiency, Fractures)   Return in about 6 months (around 12/25/2024) for HTN, hyperlipidemia (fasting).     Roselie Mood, NP

## 2024-06-26 NOTE — Assessment & Plan Note (Signed)
 stable mood with lexapro . Refill sent

## 2024-06-26 NOTE — Patient Instructions (Addendum)
 Go to lab Maintain Heart healthy diet and daily exercise. Maintain current medications. Take omeprazole 20mg  daily x 2weeks

## 2024-06-27 LAB — PARATHYROID HORMONE, INTACT (NO CA): PTH: 62 pg/mL (ref 16–77)

## 2024-07-02 ENCOUNTER — Other Ambulatory Visit (HOSPITAL_BASED_OUTPATIENT_CLINIC_OR_DEPARTMENT_OTHER): Payer: Self-pay

## 2024-07-02 MED ORDER — ATORVASTATIN CALCIUM 20 MG PO TABS
20.0000 mg | ORAL_TABLET | Freq: Every evening | ORAL | 3 refills | Status: AC
  Filled 2024-07-02: qty 90, 90d supply, fill #0
  Filled 2024-09-30: qty 90, 90d supply, fill #1

## 2024-07-02 NOTE — Assessment & Plan Note (Signed)
 Very elevated LDL and total cholesterol. I sent atovastatin 20mg  in PM. Also need to maintain a mediterranean diet. Schedule lab appointment for repeat lipid panel and hepatic panel in 2months (fasting)

## 2024-08-25 ENCOUNTER — Other Ambulatory Visit

## 2024-08-25 DIAGNOSIS — E7800 Pure hypercholesterolemia, unspecified: Secondary | ICD-10-CM | POA: Diagnosis not present

## 2024-08-25 LAB — LIPID PANEL
Cholesterol: 156 mg/dL (ref 0–200)
HDL: 42.5 mg/dL (ref >39.00)
LDL Cholesterol: 98 mg/dL (ref 0–99)
NonHDL: 113.08
Total CHOL/HDL Ratio: 4
Triglycerides: 75 mg/dL (ref 0.0–149.0)
VLDL: 15 mg/dL (ref 0.0–40.0)

## 2024-08-25 LAB — HEPATIC FUNCTION PANEL
ALT: 18 U/L (ref 0–35)
AST: 15 U/L (ref 0–37)
Albumin: 4.6 g/dL (ref 3.5–5.2)
Alkaline Phosphatase: 86 U/L (ref 39–117)
Bilirubin, Direct: 0.1 mg/dL (ref 0.0–0.3)
Total Bilirubin: 0.7 mg/dL (ref 0.2–1.2)
Total Protein: 7.7 g/dL (ref 6.0–8.3)

## 2024-08-26 ENCOUNTER — Ambulatory Visit: Payer: Self-pay | Admitting: Nurse Practitioner

## 2024-08-27 ENCOUNTER — Other Ambulatory Visit: Payer: Self-pay

## 2024-08-27 ENCOUNTER — Other Ambulatory Visit (HOSPITAL_BASED_OUTPATIENT_CLINIC_OR_DEPARTMENT_OTHER): Payer: Self-pay

## 2024-12-26 ENCOUNTER — Ambulatory Visit: Admitting: Nurse Practitioner

## 2025-04-06 ENCOUNTER — Ambulatory Visit: Admitting: Dermatology

## 2025-05-19 ENCOUNTER — Ambulatory Visit: Admitting: Internal Medicine
